# Patient Record
Sex: Male | Born: 1970
Health system: Southern US, Community
[De-identification: ages and names within clinical notes are randomized; demographics above are authoritative.]

## PROBLEM LIST (undated history)

## (undated) DIAGNOSIS — F32A Depression, unspecified: Secondary | ICD-10-CM

## (undated) DIAGNOSIS — T7840XA Allergy, unspecified, initial encounter: Secondary | ICD-10-CM

## (undated) DIAGNOSIS — F79 Unspecified intellectual disabilities: Secondary | ICD-10-CM

## (undated) DIAGNOSIS — R51 Headache: Secondary | ICD-10-CM

## (undated) DIAGNOSIS — M62838 Other muscle spasm: Secondary | ICD-10-CM

## (undated) DIAGNOSIS — G803 Athetoid cerebral palsy: Secondary | ICD-10-CM

## (undated) DIAGNOSIS — M2450 Contracture, unspecified joint: Secondary | ICD-10-CM

## (undated) DIAGNOSIS — R569 Unspecified convulsions: Secondary | ICD-10-CM

## (undated) DIAGNOSIS — G808 Other cerebral palsy: Secondary | ICD-10-CM

## (undated) DIAGNOSIS — G809 Cerebral palsy, unspecified: Secondary | ICD-10-CM

## (undated) DIAGNOSIS — F329 Major depressive disorder, single episode, unspecified: Secondary | ICD-10-CM

## (undated) DIAGNOSIS — R519 Headache, unspecified: Secondary | ICD-10-CM

## (undated) HISTORY — DX: Other cerebral palsy: G80.8

## (undated) HISTORY — DX: Unspecified intellectual disabilities: F79

## (undated) HISTORY — PX: COLON BIOPSY: SHX1369

## (undated) HISTORY — DX: Contracture, unspecified joint: M24.50

## (undated) HISTORY — DX: Allergy, unspecified, initial encounter: T78.40XA

## (undated) HISTORY — DX: Other muscle spasm: M62.838

## (undated) HISTORY — PX: OTHER SURGICAL HISTORY: SHX169

## (undated) HISTORY — DX: Athetoid cerebral palsy: G80.3

## (undated) HISTORY — DX: Cerebral palsy, unspecified: G80.9

## (undated) HISTORY — PX: TOE SURGERY: SHX1073

---

## 1998-08-03 ENCOUNTER — Ambulatory Visit (HOSPITAL_COMMUNITY): Admission: RE | Admit: 1998-08-03 | Discharge: 1998-08-03 | Payer: Self-pay | Admitting: Orthopaedic Surgery

## 2000-07-27 ENCOUNTER — Emergency Department (HOSPITAL_COMMUNITY): Admission: EM | Admit: 2000-07-27 | Discharge: 2000-07-27 | Payer: Self-pay | Admitting: Emergency Medicine

## 2004-05-11 ENCOUNTER — Encounter: Admission: RE | Admit: 2004-05-11 | Discharge: 2004-05-11 | Payer: Self-pay | Admitting: Family Medicine

## 2004-05-12 ENCOUNTER — Emergency Department (HOSPITAL_COMMUNITY): Admission: EM | Admit: 2004-05-12 | Discharge: 2004-05-12 | Payer: Self-pay | Admitting: Emergency Medicine

## 2004-06-04 ENCOUNTER — Emergency Department (HOSPITAL_COMMUNITY): Admission: EM | Admit: 2004-06-04 | Discharge: 2004-06-04 | Payer: Self-pay | Admitting: Emergency Medicine

## 2004-06-06 ENCOUNTER — Inpatient Hospital Stay (HOSPITAL_COMMUNITY): Admission: EM | Admit: 2004-06-06 | Discharge: 2004-06-07 | Payer: Self-pay | Admitting: Emergency Medicine

## 2006-04-08 ENCOUNTER — Ambulatory Visit: Payer: Self-pay | Admitting: Family Medicine

## 2006-04-16 ENCOUNTER — Encounter: Admission: RE | Admit: 2006-04-16 | Discharge: 2006-04-16 | Payer: Self-pay | Admitting: Family Medicine

## 2006-06-03 ENCOUNTER — Ambulatory Visit: Payer: Self-pay | Admitting: Family Medicine

## 2006-09-03 ENCOUNTER — Ambulatory Visit: Payer: Self-pay | Admitting: Family Medicine

## 2006-10-04 ENCOUNTER — Ambulatory Visit: Payer: Self-pay | Admitting: Family Medicine

## 2006-10-06 ENCOUNTER — Inpatient Hospital Stay (HOSPITAL_COMMUNITY): Admission: EM | Admit: 2006-10-06 | Discharge: 2006-10-09 | Payer: Self-pay | Admitting: Emergency Medicine

## 2006-10-07 ENCOUNTER — Ambulatory Visit: Payer: Self-pay | Admitting: *Deleted

## 2006-10-16 ENCOUNTER — Ambulatory Visit: Payer: Self-pay | Admitting: Family Medicine

## 2007-07-10 ENCOUNTER — Ambulatory Visit: Payer: Self-pay | Admitting: Family Medicine

## 2007-10-14 ENCOUNTER — Ambulatory Visit: Payer: Self-pay | Admitting: Family Medicine

## 2007-10-16 ENCOUNTER — Ambulatory Visit: Payer: Self-pay | Admitting: Family Medicine

## 2007-10-21 ENCOUNTER — Ambulatory Visit: Payer: Self-pay | Admitting: Family Medicine

## 2007-10-23 ENCOUNTER — Ambulatory Visit: Payer: Self-pay | Admitting: Family Medicine

## 2008-07-05 ENCOUNTER — Ambulatory Visit: Payer: Self-pay | Admitting: Family Medicine

## 2008-08-10 ENCOUNTER — Ambulatory Visit: Payer: Self-pay | Admitting: Family Medicine

## 2008-09-03 ENCOUNTER — Ambulatory Visit: Payer: Self-pay | Admitting: Family Medicine

## 2008-09-21 ENCOUNTER — Encounter: Admission: RE | Admit: 2008-09-21 | Discharge: 2008-09-21 | Payer: Self-pay | Admitting: Family Medicine

## 2008-09-21 ENCOUNTER — Ambulatory Visit: Payer: Self-pay | Admitting: Family Medicine

## 2009-01-11 ENCOUNTER — Ambulatory Visit: Payer: Self-pay | Admitting: Family Medicine

## 2009-03-15 ENCOUNTER — Ambulatory Visit: Payer: Self-pay | Admitting: Family Medicine

## 2009-06-01 ENCOUNTER — Ambulatory Visit: Payer: Self-pay | Admitting: Family Medicine

## 2009-08-16 ENCOUNTER — Ambulatory Visit: Payer: Self-pay | Admitting: Family Medicine

## 2009-08-20 ENCOUNTER — Emergency Department (HOSPITAL_BASED_OUTPATIENT_CLINIC_OR_DEPARTMENT_OTHER): Admission: EM | Admit: 2009-08-20 | Discharge: 2009-08-20 | Payer: Self-pay | Admitting: Emergency Medicine

## 2009-08-20 ENCOUNTER — Ambulatory Visit: Payer: Self-pay | Admitting: Diagnostic Radiology

## 2010-01-27 ENCOUNTER — Ambulatory Visit: Payer: Self-pay | Admitting: Family Medicine

## 2010-01-27 ENCOUNTER — Emergency Department (HOSPITAL_BASED_OUTPATIENT_CLINIC_OR_DEPARTMENT_OTHER): Admission: EM | Admit: 2010-01-27 | Discharge: 2010-01-27 | Payer: Self-pay | Admitting: Emergency Medicine

## 2010-01-27 ENCOUNTER — Ambulatory Visit: Payer: Self-pay | Admitting: Diagnostic Radiology

## 2010-01-28 ENCOUNTER — Emergency Department (HOSPITAL_BASED_OUTPATIENT_CLINIC_OR_DEPARTMENT_OTHER): Admission: EM | Admit: 2010-01-28 | Discharge: 2010-01-28 | Payer: Self-pay | Admitting: Emergency Medicine

## 2010-03-07 ENCOUNTER — Ambulatory Visit: Payer: Self-pay | Admitting: Family Medicine

## 2010-06-06 ENCOUNTER — Encounter
Admission: RE | Admit: 2010-06-06 | Discharge: 2010-06-06 | Payer: Self-pay | Source: Home / Self Care | Attending: Family Medicine | Admitting: Family Medicine

## 2010-08-09 ENCOUNTER — Emergency Department (INDEPENDENT_AMBULATORY_CARE_PROVIDER_SITE_OTHER): Payer: PRIVATE HEALTH INSURANCE

## 2010-08-09 ENCOUNTER — Emergency Department (HOSPITAL_BASED_OUTPATIENT_CLINIC_OR_DEPARTMENT_OTHER)
Admission: EM | Admit: 2010-08-09 | Discharge: 2010-08-09 | Disposition: A | Discharge status: Home / Self Care | Payer: PRIVATE HEALTH INSURANCE | Attending: Emergency Medicine | Admitting: Emergency Medicine

## 2010-08-09 DIAGNOSIS — H538 Other visual disturbances: Secondary | ICD-10-CM

## 2010-08-09 DIAGNOSIS — W2209XA Striking against other stationary object, initial encounter: Secondary | ICD-10-CM | POA: Insufficient documentation

## 2010-08-09 DIAGNOSIS — L989 Disorder of the skin and subcutaneous tissue, unspecified: Secondary | ICD-10-CM | POA: Insufficient documentation

## 2010-08-09 DIAGNOSIS — Y92009 Unspecified place in unspecified non-institutional (private) residence as the place of occurrence of the external cause: Secondary | ICD-10-CM | POA: Insufficient documentation

## 2010-08-09 DIAGNOSIS — G809 Cerebral palsy, unspecified: Secondary | ICD-10-CM | POA: Insufficient documentation

## 2010-08-09 DIAGNOSIS — S0003XA Contusion of scalp, initial encounter: Secondary | ICD-10-CM | POA: Insufficient documentation

## 2010-08-09 DIAGNOSIS — Y93E1 Activity, personal bathing and showering: Secondary | ICD-10-CM | POA: Insufficient documentation

## 2010-08-14 LAB — ROCKY MTN SPOTTED FVR AB, IGM-BLOOD: RMSF IgM: 0.1 IV (ref 0.00–0.89)

## 2010-08-14 LAB — ROCKY MTN SPOTTED FVR AB, IGG-BLOOD: RMSF IgG: 0.59 IV

## 2010-09-19 NOTE — H&P (Signed)
NAME:  TAITEN, BRAWN              ACCOUNT NO.:  [id number]   MEDICAL RECORD NO.:  [id number]            PATIENT TYPE:   LOCATION:                                 FACILITY:   PHYSICIAN:  Wilson Singer, M.D.     DATE OF BIRTH:   DATE OF ADMISSION:  10/06/2006  DATE OF DISCHARGE:                              HISTORY & PHYSICAL   HISTORY:  This is a 40 year old man who has cerebral palsy and who now  presents with a 3 day history of left arm/elbow/forearm redness and  swelling which has not improved with outpatient ciprofloxacin.  Apparently the redness started around the left elbow 3 days ago and  seems to have progressed with streaking toward the left forearm and now  is even entering the left hand.  The patient has had low grade fevers at  the physician's office.  He, otherwise, is eating and drinking well  without any other systemic effects.  The etiology of the cellulitis is  not obviously clear from the history.   PAST MEDICAL HISTORY:  Cerebral palsy from birth.   PAST SURGICAL HISTORY:  Tendon surgery in the past as a child on his  legs.   MEDICATIONS:  He is on no regular medications.   ALLERGIES:  1. PENICILLIN which produced a rash.  2. On his last hospitalization, in 2006, he had some kind of allergic      reaction to AVELOX.   SOCIAL HISTORY:  He lives with his parents.  He does not smoke and does  not drink alcohol.  He is unemployed.   FAMILY HISTORY:  Noncontributory.   REVIEW OF SYSTEMS:  Apart from the symptoms mentioned above, there are  no other symptoms referable to all systems reviewed.   PHYSICAL EXAMINATION:  VITAL SIGNS:  Temperature is 98.8, blood pressure  144/94, pulse 90 per minute, respiratory rate 13, saturation 99%.  GENERAL:  He does not look clinically toxic.  He is alert and awake.  CARDIOVASCULAR:  The heart sounds are present and normal without  murmurs.  RESPIRATORY:  Lung fields are clear.  ABDOMEN:  Soft and non-tender.  NEUROLOGICAL:  He had contractures in his arms and legs consistent with  his cerebral palsy.  SKIN:  There is spreading cellulitis in the left arm/elbow/forearm area.  There is possible as source of entry at the left elbow area.   INVESTIGATIONS:  Hemoglobin 13.1, white blood cell count 8800, platelet  count 180,000, sodium 137, potassium 3.7, chloride 103, bicarbonate 27,  glucose 97, BUN 11, creatinine 0.78.   IMPRESSION:  1. Left arm cellulitis.  2. Cerebral palsy.   PLAN:  1. Admit.  2. Intravenous antibiotics.  3. Further recommendations will depend on the patient's hospital      progress.      Wilson Singer, M.D.  Electronically Signed     NCG/MEDQ  D:  10/06/2006  T:  10/06/2006  Job:  [id number]   cc:   Sharlot Gowda, M.D.  Fax: [phone]

## 2010-09-19 NOTE — Discharge Summary (Signed)
NAMEBREYLON, Joel Dudley              ACCOUNT NO.:  [id number]   MEDICAL RECORD NO.:  [id number]          PATIENT TYPE:  INP   LOCATION:  1609                         FACILITY:  [facility]   PHYSICIAN:  Madaline Savage, MD        DATE OF BIRTH:  06/20/1970   DATE OF ADMISSION:  10/06/2006  DATE OF DISCHARGE:  10/09/2006                               DISCHARGE SUMMARY   PRIMARY CARE PHYSICIAN:  Sharlot Gowda, M.D.   DISCHARGE DIAGNOSES:  1. Left elbow cellulitis.  2. Cerebral palsy.   DISCHARGE MEDICATIONS:  Doxycycline 100 mg twice daily for 7 more days.   HISTORY OF PRESENT ILLNESS:  For a full history and physical, see the  history and physical dictated by Dr. Karilyn Cota.  In short, Joel Dudley is a  40 year old gentleman with a history of cerebral palsy who comes in with  3 days of left arm/elbow redness and swelling for which he was started  on ciprofloxacin as an outpatient and which has not improved.  He was  admitted for IV antibiotics.   PROBLEM LIST:  1. Left elbow cellulitis.  He was admitted and started on IV      antibiotics.  He was started on Ancef while he was in the hospital,      and cultures were obtained.  His blood cultures have all been      negative while in the hospital, and his antibiotic is now being      changed to doxycycline.  He is allergic to penicillin, and he has a      questionable allergy to Avelox.  His swelling is considerably      improved, the redness has come down, and we will complete his      course of antibiotics with 7 more days of doxycycline at home.  2. Cerebral palsy.  He will follow up with his family doctor.   DISPOSITION AT THIS TIME:  He is stable enough to be discharged home.   FOLLOWUP:  He is asked to follow up with his primary care doctor, Dr.  Susann Givens, in 1 week.      Madaline Savage, MD  Electronically Signed     PKN/MEDQ  D:  10/09/2006  T:  10/09/2006  Job:  [id number]

## 2010-09-22 NOTE — Discharge Summary (Signed)
Joel Dudley, Joel Dudley              ACCOUNT NO.:  [id number]   MEDICAL RECORD NO.:  [id number]          PATIENT TYPE:  INP   LOCATION:  0482                         FACILITY:  [facility]   PHYSICIAN:  Elliot Cousin, M.D.    DATE OF BIRTH:  [birth year]   DATE OF ADMISSION:  06/06/2004  DATE OF DISCHARGE:  06/07/2004                                 DISCHARGE SUMMARY   DISCHARGE DIAGNOSES:  1.  Right lower extremity cellulitis, status post spider bite.  2.  Allergic reaction to Avelox.  3.  History of PENICILLIN allergy.  4.  Cerebral palsy.   DISCHARGE MEDICATIONS:  1.  Erythromycin 333 mg, 2 tablets daily for an additional five days.  2.  If the patient cannot tolerate erythromycin then start Zithromax 250 mg      daily for the next five days.  3.  Flexeril 10 mg b.i.d.  4.  Elocon ointment 0.1% applied to the affected skin once daily for the      next seven days.   DISPOSITION:  The patient was discharged to home in improved and stable  condition on June 07, 2004.  The patient (via his mother) was advised to  followup with Dr. Susann Givens in 5-7 days.   HISTORY OF PRESENT ILLNESS:  The patient is a 40 year old man with a history  of cerebral palsy who presented to the emergency department on June 05, 2004 with swelling and redness and tenderness of the right leg.  The patient  was actually seen in the emergency department on June 04, 2004 for the  same symptoms. Apparently the patient had been bitten by a spider.  The  patient was prescribed erythromycin by the emergency department physician.  The patient had taken at least two doses of the erythromycin.  However,  there was more increase in swelling and redness of the leg and therefore the  patient presented to the hospital again.   HOSPITAL COURSE:  #1.  RIGHT LOWER EXTREMITY CELLULITIS.  The history of a  spider bite was presented by the patient's mother.  The patient apparently  had completed two doses of erythromycin as  it was prescribed by the  emergency department physician on June 04, 2004.  On admission, the  patient's white blood cell count was completely within normal limits and he  had no signs or symptoms consistent with fever.  His temperature was 96 on  admission.  Avelox 400 mg IV was started empirically for what may have been  a superimposed bacterial infection.  However, the patient developed swelling  and redness at the IV site where the Avelox was infusing. Therefore the  Avelox was discontinued and the patient was started on Zithromax 500 mg IV  daily.  Blood cultures were ordered during the hospitalization.  The blood  cultures were negative x1 day.  The following day, the patient remained  afebrile and his white blood cell count remained within normal limits at  6.9.  There was less diffuse erythema of the right leg per the mother's  assessment.  On my followup exam (although I did not see  the patient  initially in the ED), there was focal mild erythema over what was thought to  be the spider bite site. There was very little or no generalized erythema of  the right leg.  There was mild tenderness over the small area where the  spider bite may have occurred.  When asked the patient stated that the pain  was less intense and the swelling had improved.  On followup assessment, it  did not appear that the area was particularly infectious; however, it was  felt that the erythema was more or less an inflammatory response from the  spider bite. Therefore the patient was prescribed a topical steroid, Elocon,  to be applied to the irritated site once daily for seven days.  The patient  via his mother was also advised to continue antibiotic therapy as tolerated  for the next five days. This would be empiric treatment.  The patient was  stable enough to be discharged the following day.  He was less symptomatic  and he was afebrile and did not have a leukocytosis.  The patient and his  mother were  also advised to apply warm compresses to the right leg 3-4 times  daily as needed and to cleanse the area with an antibacterial soap daily.   The patient will followup with his primary care physician, Dr. Susann Givens, in 5-  7 days.  The patient and the patient's mother were advised to return to the  hospital if  the patient developed fever and chills.      DF/MEDQ  D:  06/08/2004  T:  06/08/2004  Job:  [id number]   cc:   Sharlot Gowda, M.D.  [address]  [place], [place] [address]  Fax: [phone]

## 2010-09-22 NOTE — H&P (Signed)
NAMEHAYDIN, CALANDRA              ACCOUNT NO.:  [id number]   MEDICAL RECORD NO.:  [id number]          PATIENT TYPE:  INP   LOCATION:  0482                         [facility]ACILITY:  [facility]   PHYSICIAN:  Lonia Blood, M.D.      DATE OF BIRTH:  Nov 11, 1970   DATE OF ADMISSION:  06/05/2004  DATE OF DISCHARGE:                                HISTORY & PHYSICAL   PRIMARY CARE PHYSICIAN:  Dr. Sharlot Gowda   PRESENTING COMPLAINT:  Right lower extremity pain and swelling.   HISTORY OF PRESENT ILLNESS:  This is a 40 year old male with history of  cerebral palsy who was brought in by his Mom secondary to bitten by a  spider last Saturday on the right mid leg.  The patient has since then been  having progressive swelling and pain of the right lower extremity. He was  seen in the ED on June 04, 2004, started on oral antibiotics, Zithromax  to be exact. The patient's condition did not improve. The next day he was  taken to see Dr. Susann Givens on June 05, 2004 who saw the patient and  informed the family to return to the ED if the swelling got worse.  The  patient was brought back to the ED on the night of June 05, 2004 because  of increase in his lower extremity swelling.  According to Mom there has  been no fever. However, the pain continues.   PAST MEDICAL HISTORY:  Mainly cerebral palsy.   ALLERGIES:  PENICILLIN.   SOCIAL HISTORY:  The patient lives at home with his Mom who takes care of  him. No tobacco or alcohol use.   FAMILY HISTORY:  No family history of hypertension, diabetes, or cancer at  this point.   REVIEW OF SYSTEMS:  Essentially not obtainable. The patient does not give an  adequate history, but per Mom the patient's condition has not changed  significantly except for these symptoms.  He has some back pain, otherwise  no other complaints.   PHYSICAL EXAMINATION:  VITAL SIGNS:  Temperature is 96, blood pressure  123/86, pulse 112, respiratory rate 28, saturations 97% on room  air.  GENERAL:  The patient is lying in bed, very alert. Moaning slightly in pain.  Responded to voice but not communicating adequately. The patient has a  cerebral palsy posture with mild opening to the side.  HEENT:  Pupils are equal, round and reactive to light.  NECK:  Supple, no jugular venous distension, no lymphadenopathy.  CHEST:  Good air entry bilaterally. No wheezes or rales.  CARDIOVASCULAR:  The patient is tachycardic.  ABDOMEN:  Soft, nontender with positive bowel sounds.  EXTREMITIES:  Show no edema, cyanosis or clubbing. Absence of the right  great toe. Also showed contractures consistent with cerebral palsy.   ASSESSMENT:  This is a 40 year old patient with cerebral palsy coming in  with left lower extremity swelling. The right leg looks red, swollen, with a  raised papule in the shin area. The area is swollen and warm. Mainly  consistent with possible cellulitis. Family feels patient may have been  bitten  by a spider, however there is no explanation for his current symptoms  and signs. In light of the fact that patient has tried oral antibiotics, I  have explained to the mother that since the patient responded to the initial  oral antibiotic, the worsening of the inflammation is consistent with a  situation where by the bacteria is killed by the antibiotics, thereby  releasing its contents and inducing over-inflammation. With apprehension of  the family and the fact the patient has poor understanding of his status, I  will go ahead and admit him for probable 23-hour observation on some IV  antibiotics.  Once his condition stabilizes will switch to oral. The patient is allergic  to Penicillin so I will start him on some quinolones as an interim measure.  I will use Avelox for now. He will probably be discharged on oral Avelox or  Levaquin or any of the other quinolones. In the meantime however, for his  cerebral palsy, I will continue him on the Flexeril that he is  taking. I  will also give him some low dose Morphine and Tylenol for pain control.      LG/MEDQ  D:  06/06/2004  T:  06/06/2004  Job:  [id number]   cc:   [facility]   Sharlot Gowda, M.D.  [address]  [place], [place] [address]  Fax: [phone]

## 2010-11-12 ENCOUNTER — Emergency Department (HOSPITAL_BASED_OUTPATIENT_CLINIC_OR_DEPARTMENT_OTHER)
Admission: EM | Admit: 2010-11-12 | Discharge: 2010-11-12 | Disposition: A | Discharge status: Home / Self Care | Payer: PRIVATE HEALTH INSURANCE | Attending: Emergency Medicine | Admitting: Emergency Medicine

## 2010-11-12 ENCOUNTER — Encounter: Payer: Self-pay | Admitting: *Deleted

## 2010-11-12 DIAGNOSIS — S0180XA Unspecified open wound of other part of head, initial encounter: Secondary | ICD-10-CM | POA: Insufficient documentation

## 2010-11-12 DIAGNOSIS — S0181XA Laceration without foreign body of other part of head, initial encounter: Secondary | ICD-10-CM

## 2010-11-12 DIAGNOSIS — S1093XA Contusion of unspecified part of neck, initial encounter: Secondary | ICD-10-CM | POA: Insufficient documentation

## 2010-11-12 DIAGNOSIS — W19XXXA Unspecified fall, initial encounter: Secondary | ICD-10-CM | POA: Insufficient documentation

## 2010-11-12 DIAGNOSIS — S0003XA Contusion of scalp, initial encounter: Secondary | ICD-10-CM | POA: Insufficient documentation

## 2010-11-12 DIAGNOSIS — S0083XA Contusion of other part of head, initial encounter: Secondary | ICD-10-CM

## 2010-11-12 NOTE — ED Provider Notes (Signed)
History     Chief Complaint  Patient presents with  . Fall  . Head Laceration   Patient is a 40 y.o. male presenting with fall. The history is provided by the patient, a caregiver and a parent. The history is limited by the condition of the patient.  Fall Incident onset: 7 am this morning. The fall occurred while standing. He landed on a hard floor. The point of impact was the head. Pertinent negatives include no fever and no vomiting.  pt w hx cp, in wheelchair. Gets around home on hands, knees and/or wheelchair. This am in bathroom, slipped on floor, hit forehead on tile floor. No loc. Acting normally since, at baseline. No vomiting. No other c/o. Had lac to forehead which has stopped bleeding. No nosebleeds.  Has remained content/alert.   History reviewed. No pertinent past medical history.  History reviewed. No pertinent past surgical history.  History reviewed. No pertinent family history.  History  Substance Use Topics  . Smoking status: Never Smoker   . Smokeless tobacco: Not on file  . Alcohol Use: No      Review of Systems  Unable to perform ROS Constitutional: Negative for fever.  HENT: Negative for nosebleeds.   Respiratory: Negative for shortness of breath.   Gastrointestinal: Negative for vomiting.  Psychiatric/Behavioral: Negative for behavioral problems.    Physical Exam  BP 116/77  Pulse 80  Temp(Src) 97.7 F (36.5 C) (Oral)  Resp 18  SpO2 100%  Physical Exam  Nursing note and vitals reviewed. Constitutional: He is oriented to person, place, and time. He appears well-developed and well-nourished. No distress.  HENT:       Contusion w < 1 cm, superficial, healing lac to forehead w skin edges well approx, no fb seen or felt. No sign of infection of wound.   Eyes: Pupils are equal, round, and reactive to light.  Neck: Neck supple. No tracheal deviation present.  Cardiovascular: Normal rate.   Pulmonary/Chest: Effort normal. No accessory muscle usage.  No respiratory distress.  Abdominal: He exhibits no distension.  Musculoskeletal: Normal range of motion.       Cervical back: Normal. He exhibits no tenderness, no swelling and no pain.  Neurological: He is alert and oriented to person, place, and time.  Skin: Skin is warm and dry.  Psychiatric: He has a normal mood and affect.    ED Course  Procedures  MDM Spine nt. Pt alert, ms at baseline since fall early this am. Wound healing. Wound cleaned, staff to apply steristrips. fam states tet is up to date.       Suzi Roots, MD 11/12/10 [phone]

## 2010-11-12 NOTE — ED Notes (Signed)
Pt presents to ED today s/p fall in shower this am striking head on ceramic tile. Bleeding is controlled at present.  Pt has special needs and is wheelchair bound.  Mother states no changes in LOC today

## 2010-11-17 ENCOUNTER — Encounter: Payer: Self-pay | Admitting: Family Medicine

## 2010-11-17 ENCOUNTER — Telehealth: Payer: Self-pay

## 2010-11-17 ENCOUNTER — Ambulatory Visit (INDEPENDENT_AMBULATORY_CARE_PROVIDER_SITE_OTHER): Payer: PRIVATE HEALTH INSURANCE | Admitting: Family Medicine

## 2010-11-17 VITALS — BP 116/70 | HR 60

## 2010-11-17 DIAGNOSIS — S0081XA Abrasion of other part of head, initial encounter: Secondary | ICD-10-CM

## 2010-11-17 DIAGNOSIS — IMO0002 Reserved for concepts with insufficient information to code with codable children: Secondary | ICD-10-CM

## 2010-11-17 NOTE — Patient Instructions (Signed)
Call me if the swelling, redness or temperature gets worse

## 2010-11-17 NOTE — Progress Notes (Signed)
  Subjective:    Patient ID: Joel Dudley, male    DOB: Apr 25, 1971, 40 y.o.   MRN: [id number]  HPI He fell injuring his face several days ago. His mother noted some swelling today and brought him in for evaluation for possible infection.   Review of Systems     Objective:   Physical Exam Alert and in no distress. 2 healing abrasions/lacerations are noted. One on the midforehead and one on the left side of the nose. They're not erythematous warm or tender.       Assessment & Plan:  Facial abrasions Reassured the patient and his mother that they did not appear infected. Recommend keeping in touch with me and if evidence of infection,they will call me.

## 2010-11-17 NOTE — Telephone Encounter (Signed)
PT MOM CALLED AND SAID THAT SHE WANTS THE ANTIBIOTIC THAT YA'LL TALKED ABOUT

## 2010-11-27 ENCOUNTER — Ambulatory Visit (INDEPENDENT_AMBULATORY_CARE_PROVIDER_SITE_OTHER): Payer: PRIVATE HEALTH INSURANCE | Admitting: Family Medicine

## 2010-11-27 ENCOUNTER — Encounter: Payer: Self-pay | Admitting: Family Medicine

## 2010-11-27 VITALS — BP 130/80 | HR 86 | Temp 98.5°F

## 2010-11-27 DIAGNOSIS — R5383 Other fatigue: Secondary | ICD-10-CM

## 2010-11-27 DIAGNOSIS — G809 Cerebral palsy, unspecified: Secondary | ICD-10-CM

## 2010-11-27 DIAGNOSIS — G801 Spastic diplegic cerebral palsy: Secondary | ICD-10-CM

## 2010-11-27 DIAGNOSIS — G808 Other cerebral palsy: Secondary | ICD-10-CM | POA: Insufficient documentation

## 2010-11-27 LAB — CBC WITH DIFFERENTIAL/PLATELET
Eosinophils Relative: 1 % (ref 0–5)
HCT: 41.1 % (ref 39.0–52.0)
Hemoglobin: 14.1 g/dL (ref 13.0–17.0)
Lymphocytes Relative: 27 % (ref 12–46)
Lymphs Abs: 1.8 10*3/uL (ref 0.7–4.0)
MCV: 86.7 fL (ref 78.0–100.0)
Platelets: 247 10*3/uL (ref 150–400)
RBC: 4.74 MIL/uL (ref 4.22–5.81)
WBC: 6.6 10*3/uL (ref 4.0–10.5)

## 2010-11-27 LAB — COMPREHENSIVE METABOLIC PANEL
ALT: 10 U/L (ref 0–53)
CO2: 23 mEq/L (ref 19–32)
Calcium: 9.3 mg/dL (ref 8.4–10.5)
Chloride: 105 mEq/L (ref 96–112)
Creat: 0.99 mg/dL (ref 0.50–1.35)
Sodium: 139 mEq/L (ref 135–145)
Total Protein: 6.5 g/dL (ref 6.0–8.3)

## 2010-11-27 LAB — TSH: TSH: 3.358 u[IU]/mL (ref 0.350–4.500)

## 2010-11-27 NOTE — Progress Notes (Signed)
  Subjective:    Patient ID: Joel Dudley, male    DOB: [birth year], 40 y.o.   MRN: [id number]  HPI And he is here complaining of difficulty with a jerking sensation on the inside for the last several months. He has had some episodes of vomiting headache well as some dizziness but no earache, sore throat, cough or congestion   Review of Systems     Objective:   Physical Exam alert and in no distress. Tympanic membranes and canals are normal. Throat is clear. Tonsils are normal. Neck is supple without adenopathy or thyromegaly. Cardiac exam shows a regular sinus rhythm without murmurs or gallops. Lungs are clear to auscultation. Abdominal exam shows no masses or tenderness but exam difficult due to to muscle tension        Assessment & Plan:  CP. Malaise and fatigue, etiology unclear Routine blood screening.

## 2010-11-27 NOTE — Patient Instructions (Signed)
Keep an eye on him in case anything changes and I will call when I get the results

## 2010-11-28 ENCOUNTER — Telehealth: Payer: Self-pay

## 2010-11-28 NOTE — Telephone Encounter (Signed)
Called pt informed mom of results

## 2010-12-15 ENCOUNTER — Telehealth: Payer: Self-pay | Admitting: Family Medicine

## 2010-12-15 NOTE — Telephone Encounter (Signed)
MOM INFORMED-LM

## 2010-12-15 NOTE — Telephone Encounter (Signed)
Mom called about rx for Flexeril      Mom# [phone]

## 2011-02-22 LAB — COMPREHENSIVE METABOLIC PANEL
ALT: 28
Albumin: 2.9 — ABNORMAL LOW
Alkaline Phosphatase: 52
GFR calc Af Amer: >60
Potassium: 4
Sodium: 138
Total Protein: 5.5 — ABNORMAL LOW

## 2011-02-22 LAB — CBC
HCT: 41.9
Hemoglobin: 14.4
MCHC: 34.5
Platelets: 194
RBC: 4.76
RDW: 12.3
RDW: 12.5

## 2011-02-22 LAB — BASIC METABOLIC PANEL
CO2: 30
GFR calc non Af Amer: >60
Glucose, Bld: 100 — ABNORMAL HIGH
Potassium: 4.1
Sodium: 139

## 2011-03-13 ENCOUNTER — Ambulatory Visit (INDEPENDENT_AMBULATORY_CARE_PROVIDER_SITE_OTHER): Payer: PRIVATE HEALTH INSURANCE | Admitting: Family Medicine

## 2011-03-13 ENCOUNTER — Encounter: Payer: Self-pay | Admitting: Family Medicine

## 2011-03-13 VITALS — BP 120/80 | HR 80 | Ht 60.0 in | Wt 165.0 lb

## 2011-03-13 DIAGNOSIS — Z23 Encounter for immunization: Secondary | ICD-10-CM

## 2011-03-13 DIAGNOSIS — G809 Cerebral palsy, unspecified: Secondary | ICD-10-CM

## 2011-03-13 MED ORDER — CARISOPRODOL 350 MG PO TABS: 350.0000 mg | ORAL_TABLET | Freq: Two times a day (BID) | ORAL | Status: DC | PRN

## 2011-03-13 NOTE — Progress Notes (Signed)
  Subjective:    Patient ID: Joel Dudley, male    DOB: July 09, 1970, 40 y.o.   MRN: [id number]  HPI He is here for an interval evaluation. He continues to do quite well. He does want a [employer]. His mother also notes that soma does help him control his spasticity. She would like a refill on this. He apparently was seen earlier this year for evaluation of chest pain and did follow up with a cardiologist. To use aspirin and do have nitroglycerin however the workup was apparently negative. They have no other concerns or complaints.   Review of Systems     Objective:   Physical Exam alert and in no distress. Tympanic membranes and canals are normal. Throat is clear. Tonsils are normal. Neck is supple without adenopathy or thyromegaly. Cardiac exam shows a regular sinus rhythm without murmurs or gallops. Lungs are clear to auscultation. Abdominal exam shows no masses or tenderness       Assessment & Plan:  Cerebral palsy. I will call in soma. Flu shot also given.

## 2011-04-20 ENCOUNTER — Encounter (HOSPITAL_COMMUNITY): Payer: Self-pay | Admitting: *Deleted

## 2011-04-20 ENCOUNTER — Emergency Department (HOSPITAL_COMMUNITY): Payer: PRIVATE HEALTH INSURANCE

## 2011-04-20 ENCOUNTER — Emergency Department (HOSPITAL_COMMUNITY)
Admission: EM | Admit: 2011-04-20 | Discharge: 2011-04-20 | Disposition: A | Discharge status: Home / Self Care | Payer: PRIVATE HEALTH INSURANCE | Attending: Emergency Medicine | Admitting: Emergency Medicine

## 2011-04-20 DIAGNOSIS — S0510XA Contusion of eyeball and orbital tissues, unspecified eye, initial encounter: Secondary | ICD-10-CM | POA: Insufficient documentation

## 2011-04-20 DIAGNOSIS — F79 Unspecified intellectual disabilities: Secondary | ICD-10-CM | POA: Insufficient documentation

## 2011-04-20 DIAGNOSIS — Z7982 Long term (current) use of aspirin: Secondary | ICD-10-CM | POA: Insufficient documentation

## 2011-04-20 DIAGNOSIS — W19XXXA Unspecified fall, initial encounter: Secondary | ICD-10-CM

## 2011-04-20 DIAGNOSIS — S0083XA Contusion of other part of head, initial encounter: Secondary | ICD-10-CM

## 2011-04-20 DIAGNOSIS — Z79899 Other long term (current) drug therapy: Secondary | ICD-10-CM | POA: Insufficient documentation

## 2011-04-20 DIAGNOSIS — S0511XA Contusion of eyeball and orbital tissues, right eye, initial encounter: Secondary | ICD-10-CM

## 2011-04-20 DIAGNOSIS — R51 Headache: Secondary | ICD-10-CM | POA: Insufficient documentation

## 2011-04-20 DIAGNOSIS — S0003XA Contusion of scalp, initial encounter: Secondary | ICD-10-CM | POA: Insufficient documentation

## 2011-04-20 DIAGNOSIS — S0990XA Unspecified injury of head, initial encounter: Secondary | ICD-10-CM | POA: Insufficient documentation

## 2011-04-20 DIAGNOSIS — H538 Other visual disturbances: Secondary | ICD-10-CM | POA: Insufficient documentation

## 2011-04-20 DIAGNOSIS — W07XXXA Fall from chair, initial encounter: Secondary | ICD-10-CM | POA: Insufficient documentation

## 2011-04-20 DIAGNOSIS — H5789 Other specified disorders of eye and adnexa: Secondary | ICD-10-CM | POA: Insufficient documentation

## 2011-04-20 DIAGNOSIS — S1093XA Contusion of unspecified part of neck, initial encounter: Secondary | ICD-10-CM | POA: Insufficient documentation

## 2011-04-20 DIAGNOSIS — G809 Cerebral palsy, unspecified: Secondary | ICD-10-CM | POA: Insufficient documentation

## 2011-04-20 MED ORDER — OXYCODONE-ACETAMINOPHEN 5-325 MG PO TABS: 1.0000 | ORAL_TABLET | Freq: Four times a day (QID) | ORAL | Status: AC | PRN

## 2011-04-20 MED ORDER — OXYCODONE-ACETAMINOPHEN 5-325 MG PO TABS
2.0000 | ORAL_TABLET | Freq: Once | ORAL | Status: AC
  Administered 2011-04-20: 2 via ORAL
  Filled 2011-04-20: qty 2

## 2011-04-20 NOTE — ED Notes (Signed)
Per patient family- patient fell out of his wheel chair yesterday and he was upset today about the pads in the shower at home and jumped out of his wheel chair and hit is head and eye on hardwood floor. Patient's mother took him to hospital in [place] and was released after a CT scan revealed no broken bones.  Patients mother states that the patient started to have blurred vision. Patient denies N/V and only pain in his eye and side of his head. Patient has CP and lives with his parents. Patient understand everything going on but it is difficult to understand his speech. Patient's mother and father speak for and interprets for the patient.

## 2011-04-20 NOTE — ED Notes (Signed)
Patient resting with NAD at this time. Family at bedside giving patient sprite to drink and talking with him.

## 2011-04-20 NOTE — ED Provider Notes (Signed)
History    40yM brought in after parents after fall. Pt had fall in shower yesterday. And again today when threw self out of chair because was mad. Per mother, was evaluated at OSH yesterday and again today.  Not sure what exact imaging studies done but mother thinks had CTs. Told everything looked OK. Brought in now because says is having trouble with vision. No interim trauma that they are aware of. Pt with hx of CP and crawls/self transfers a lot. Frequently strikes head. Denies pain aside from head/face. No acute change in MS per parents. No chronic blood thinning medication use.  CSN: [id number] Arrival date & time: 04/20/2011  3:34 PM   First MD Initiated Contact with Patient 04/20/11 1543      Chief Complaint  Patient presents with  . Fall    (Consider location/radiation/quality/duration/timing/severity/associated sxs/prior treatment) HPI  Past Medical History  Diagnosis Date  . Allergy     RHINITIS  . Cerebral palsy   . MR (mental retardation)     No past surgical history on file.  No family history on file.  History  Substance Use Topics  . Smoking status: Never Smoker   . Smokeless tobacco: Not on file  . Alcohol Use: No      Review of Systems   Review of symptoms negative unless otherwise noted in HPI.   Allergies  Codeine and Penicillins  Home Medications   Current Outpatient Rx  Name Route Sig Dispense Refill  . ASPIRIN 325 MG PO TBEC Oral Take 325 mg by mouth as needed.     Marland Kitchen CARISOPRODOL 350 MG PO TABS Oral Take 1 tablet (350 mg total) by mouth 2 (two) times daily as needed. 60 tablet 5  . NITROGLYCERIN 0.3 MG SL SUBL Sublingual Place 0.3 mg under the tongue every 5 (five) minutes as needed.       BP 134/98  Pulse 129  Temp(Src) 98 F (36.7 C) (Oral)  Resp 16  SpO2 99%  Physical Exam  Nursing note and vitals reviewed. Constitutional: No distress.       Sitting up in wheel chair. General appearance consistent with hx of CP.  HENT:    Head: Normocephalic and atraumatic.  Eyes: Right eye exhibits no discharge. Left eye exhibits no discharge.       Extensive ecchymosis of R face. R eye swollen shut. Cannot actively open eye.  Neck: Neck supple.  Cardiovascular: Normal rate, regular rhythm and normal heart sounds.  Exam reveals no gallop and no friction rub.   No murmur heard. Pulmonary/Chest: Effort normal and breath sounds normal. No respiratory distress.  Abdominal: Soft. He exhibits no distension. There is no tenderness.  Musculoskeletal: He exhibits no edema and no tenderness.  Neurological: He is alert.  Skin: Skin is warm and dry.  Psychiatric: He has a normal mood and affect. His behavior is normal. Thought content normal.    ED Course  Procedures (including critical care time)  Labs Reviewed - No data to display Ct Head Wo Contrast  04/20/2011  *RADIOLOGY REPORT*  Clinical Data: Fall.  Right-sided face and scalp hematoma. Headache and blurred vision.  The patient also fell yesterday.  CT HEAD WITHOUT CONTRAST,CT CERVICAL SPINE WITHOUT CONTRAST  CT OF THE HEAD WITHOUT CONTRAST:  Technique:  Contiguous axial images were obtained from the base of the skull through the vertex without contrast.,Technique: Multidetector CT imaging of the cervical spine was performed. Multiplanar CT image reconstructions were also generated.  Technique:  Multidetector  CT imaging of the cervical spine was performed without intravenous contrast.  Multiplanar CT image reconstructions were also generated.  Comparison: 08/09/2010  Findings: There is no intra or extra-axial fluid collection or mass.  The basilar cisterns and ventricles have a normal appearance.  There is extensive soft tissue swelling involving the frontal, right temporal and parietal scalp, extending to involve the occipital scalp.  No underlying calvarial fracture. Patient motion artifact is present.  IMPRESSION:  1.  Extensive scalp edema. 2.  No evidence for fracture. 3. No  evidence for acute intracranial abnormality.  CT CERVICAL SPINE WITHOUT CONTRAST:  Findings:  There is moderate degenerative change within the cervical spine.  Changes are most notable at C3-4, C4-5, C5-6, and C6-7.  There is no evidence for acute fracture or subluxation. Lung apices are clear.  IMPRESSION:  1.  Degenerative changes in the cervical spine. 2.  No evidence for acute abnormality.  Original Report Authenticated By: Patterson Hammersmith, M.D.   Ct Cervical Spine Wo Contrast  04/20/2011  *RADIOLOGY REPORT*  Clinical Data: Fall.  Right-sided face and scalp hematoma. Headache and blurred vision.  The patient also fell yesterday.  CT HEAD WITHOUT CONTRAST,CT CERVICAL SPINE WITHOUT CONTRAST  CT OF THE HEAD WITHOUT CONTRAST:  Technique:  Contiguous axial images were obtained from the base of the skull through the vertex without contrast.,Technique: Multidetector CT imaging of the cervical spine was performed. Multiplanar CT image reconstructions were also generated.  Technique:  Multidetector CT imaging of the cervical spine was performed without intravenous contrast.  Multiplanar CT image reconstructions were also generated.  Comparison: 08/09/2010  Findings: There is no intra or extra-axial fluid collection or mass.  The basilar cisterns and ventricles have a normal appearance.  There is extensive soft tissue swelling involving the frontal, right temporal and parietal scalp, extending to involve the occipital scalp.  No underlying calvarial fracture. Patient motion artifact is present.  IMPRESSION:  1.  Extensive scalp edema. 2.  No evidence for fracture. 3. No evidence for acute intracranial abnormality.  CT CERVICAL SPINE WITHOUT CONTRAST:  Findings:  There is moderate degenerative change within the cervical spine.  Changes are most notable at C3-4, C4-5, C5-6, and C6-7.  There is no evidence for acute fracture or subluxation. Lung apices are clear.  IMPRESSION:  1.  Degenerative changes in the cervical  spine. 2.  No evidence for acute abnormality.  Original Report Authenticated By: Patterson Hammersmith, M.D.     1. Closed head injury   2. Contusion of periorbital region, right   3. Facial contusion   4. Traumatic hematoma of scalp   5. Fall     4:02 PM Pt assessed shortly after arrival. . Do not feel emergent imaging indicated at this time and will await records from OSH before proceding with further wu.   4:32 PM Records reviewed from [facility]. Pt had CT max/fac today which negative for fx. CT head/c-spine yesterday which neg for fx or acute intracranial injury. Based on records provided, does not appear to have had repeat head CT and c-spine today. Given interim trauma from scans yesterday will CT today.  MDM  40yM s/p fall. Extensive hematoma/contusion to R face. CT head/cspine neg for acute injury. Results of CT max/face reviewed from OSH which was preformed earlier today and negative as well. No change in baseline per parents. Plan prn pain meds and fu as needed.        Raeford Razor, MD 04/23/11 [phone]

## 2011-04-20 NOTE — ED Notes (Signed)
Patient discharged with mother and dad.

## 2011-04-25 ENCOUNTER — Ambulatory Visit (INDEPENDENT_AMBULATORY_CARE_PROVIDER_SITE_OTHER): Payer: PRIVATE HEALTH INSURANCE | Admitting: Family Medicine

## 2011-04-25 VITALS — BP 120/80 | HR 86

## 2011-04-25 DIAGNOSIS — S199XXA Unspecified injury of neck, initial encounter: Secondary | ICD-10-CM

## 2011-04-25 DIAGNOSIS — S0993XA Unspecified injury of face, initial encounter: Secondary | ICD-10-CM

## 2011-04-25 DIAGNOSIS — G809 Cerebral palsy, unspecified: Secondary | ICD-10-CM

## 2011-04-25 NOTE — Progress Notes (Signed)
  Subjective:    Patient ID: Joel Dudley, male    DOB: [birth year], 40 y.o.   MRN: [id number]  HPI He is here for recheck after recent visit to the hospital for head trauma. He apparently fell while taking a shower. He was seen multiple times in the emergency room for head trauma. Some of trauma her bowels on him actually hitting his head on the floor. He has not articulated this to exactly why he did this. His mother speculates that he is quite upset over what occurred approximately 4 years ago while at [employer]. Apparently they have asked both him and his mother to leave the program. He was quite happy there and had several friends including the teacher.   Review of Systems     Objective:   Physical Exam Alert and in no distress. They shows extensive ecchymotic damage as well as some conjunctival hemorrhaging bilaterally. He also has ecchymotic anterior chest.       Assessment & Plan:   1. Facial trauma   2. CP (cerebral palsy)    recommended heat and anti-inflammatory of choice for the facial, and discomfort. We also had a very long conversation concerning why he is so upset. He did break down and cry. His mother has arranged for him to be involved in many activities however apparently this has not the same as what he was used to. I gave him my e-mail address in case he wants to articulate more without his mother being involved. Also encouraged him to express his feelings through his art work. Over 45 minutes spent discussing all these issues with him and his mother

## 2011-05-11 ENCOUNTER — Telehealth: Payer: Self-pay | Admitting: Family Medicine

## 2011-05-17 NOTE — Telephone Encounter (Signed)
TSD  

## 2011-05-23 ENCOUNTER — Ambulatory Visit (INDEPENDENT_AMBULATORY_CARE_PROVIDER_SITE_OTHER): Payer: PRIVATE HEALTH INSURANCE | Admitting: Family Medicine

## 2011-05-23 ENCOUNTER — Encounter: Payer: Self-pay | Admitting: Family Medicine

## 2011-05-23 DIAGNOSIS — F329 Major depressive disorder, single episode, unspecified: Secondary | ICD-10-CM

## 2011-05-23 DIAGNOSIS — G801 Spastic diplegic cerebral palsy: Secondary | ICD-10-CM

## 2011-05-23 DIAGNOSIS — G809 Cerebral palsy, unspecified: Secondary | ICD-10-CM

## 2011-05-23 DIAGNOSIS — F32A Depression, unspecified: Secondary | ICD-10-CM | POA: Insufficient documentation

## 2011-05-23 MED ORDER — LORAZEPAM 1 MG PO TABS: 1.0000 mg | ORAL_TABLET | Freq: Three times a day (TID) | ORAL | Status: DC

## 2011-05-23 MED ORDER — RISPERIDONE 1 MG PO TABS: 1.0000 mg | ORAL_TABLET | Freq: Two times a day (BID) | ORAL | Status: DC

## 2011-05-23 NOTE — Progress Notes (Signed)
  Subjective:    Patient ID: Joel Dudley, male    DOB: 06-19-1970, 41 y.o.   MRN: [id number]  HPI He is here for a followup visit. He was recently seen in the hospital for auscultation concerning some abnormal behavior and headbutting. He did injure his head on several occasions. His cerebral palsy makes it difficult to get a good history but apparently this revolves around a program he was involved in at [employer] he is no longer able to attend. This has him quite upset. Presently he is on respirdone and Ativan which does seem to make him more calm. He currently still is acting out making comments to his mother that if things don't go his way he once hit his head.   Review of Systems     Objective:   Physical Exam Alert and in no distress and relatively calm appearing.       Assessment & Plan:   1. CP (cerebral palsy), spastic   2. Depression    T1 present medications. We will attempt to find a therapist to [place] from prior with cerebral palsy and make an appointment. Otherwise recheck with me in one or 2 months.

## 2011-05-25 ENCOUNTER — Telehealth: Payer: Self-pay | Admitting: Family Medicine

## 2011-05-25 NOTE — Telephone Encounter (Signed)
Called [name], left message to call back.  I have made contact with Migel at [facility], they replace [facility].  Located [address] [place] beside [employer] account executive  [phone].  The have a walk in Mon - Fri from 8 - 3  For your 1st time visit.  They have therapist, dr and nurses on staff.  Bring pt id and insurance.  I have also spoken with [facility] [phone] [facility] she will check and call us back.  I have also emailed Mancel Parsons Case worker for [employer], waiting for her call back.

## 2011-05-30 NOTE — Telephone Encounter (Signed)
Pt mom called and I gave her the info that I had.

## 2011-06-18 ENCOUNTER — Telehealth: Payer: Self-pay | Admitting: Family Medicine

## 2011-06-19 ENCOUNTER — Encounter: Payer: Self-pay | Admitting: Family Medicine

## 2011-06-19 ENCOUNTER — Ambulatory Visit (INDEPENDENT_AMBULATORY_CARE_PROVIDER_SITE_OTHER): Payer: PRIVATE HEALTH INSURANCE | Admitting: Family Medicine

## 2011-06-19 VITALS — BP 120/70 | HR 73

## 2011-06-19 DIAGNOSIS — IMO0001 Reserved for inherently not codable concepts without codable children: Secondary | ICD-10-CM

## 2011-06-19 DIAGNOSIS — R259 Unspecified abnormal involuntary movements: Secondary | ICD-10-CM

## 2011-06-19 DIAGNOSIS — G809 Cerebral palsy, unspecified: Secondary | ICD-10-CM

## 2011-06-19 LAB — CBC WITH DIFFERENTIAL/PLATELET
Basophils Absolute: 0.1 10*3/uL (ref 0.0–0.1)
Basophils Relative: 1 % (ref 0–1)
Eosinophils Relative: 1 % (ref 0–5)
HCT: 41.5 % (ref 39.0–52.0)
MCHC: 34.2 g/dL (ref 30.0–36.0)
MCV: 85.9 fL (ref 78.0–100.0)
Monocytes Absolute: 0.8 10*3/uL (ref 0.1–1.0)
Monocytes Relative: 10 % (ref 3–12)
RDW: 12.3 % (ref 11.5–15.5)

## 2011-06-19 LAB — COMPREHENSIVE METABOLIC PANEL
AST: 18 U/L (ref 0–37)
Alkaline Phosphatase: 73 U/L (ref 39–117)
BUN: 10 mg/dL (ref 6–23)
Calcium: 9.2 mg/dL (ref 8.4–10.5)
Chloride: 105 mEq/L (ref 96–112)
Creat: 0.89 mg/dL (ref 0.50–1.35)
Glucose, Bld: 93 mg/dL (ref 70–99)

## 2011-06-19 NOTE — Telephone Encounter (Signed)
FYI ONLY

## 2011-06-19 NOTE — Progress Notes (Signed)
  Subjective:    Patient ID: Joel Dudley, male    DOB: July 07, 1970, 41 y.o.   MRN: [id number]  HPI His mother called yesterday stating that he was again having difficulty with shaking and banging his head. When she asks him why he does the head-banging he says because he likes to do it. He was seen recently and given Ativan as well as a Rispirdal however she stopped this the other night.   Review of Systems     Objective:   Physical Exam Alert and in no distress. No obvious, noted. Heart and lung exam normal. Abdominal exam normal. No tremors were noted.       Assessment & Plan:  Etiology of his symptoms is unclear. I did recommend using 2 mg of Risperdal at night to see if this will help him if it makes his symptoms worse, use Ativan and let me know. Routine blood screening will also be done today. She currently does have a scheduled appointment with a psychiatrist at [facility].

## 2011-06-19 NOTE — Patient Instructions (Signed)
Take 2 of the respiratory and see what that does for the sleeping tonight and if you need to you can use the lorazepam

## 2011-06-20 NOTE — Progress Notes (Signed)
Quick Note:  The blood work is normal ______ 

## 2011-06-21 ENCOUNTER — Telehealth: Payer: Self-pay | Admitting: Internal Medicine

## 2011-06-21 NOTE — Telephone Encounter (Signed)
THEY SAID THEY HAVE FAXED OVER A PAPER AND ALL NEEDS TO BE DONE IS DR.LALONDE TO [facility]

## 2011-06-21 NOTE — Telephone Encounter (Signed)
CALLED [facility] TO SEE WHAT WE NEEDED TO

## 2011-06-21 NOTE — Telephone Encounter (Signed)
Work with her on this and I will fill out the appropriate paperwork or referral.

## 2011-06-21 NOTE — Telephone Encounter (Signed)
Joel [place] called in regards to [place] about a new electric wheel chair that he needs. The other one he has is more than 39 years old and the whole axle is broke. however [facility] said they would need a evaluation done before he is able to get a new wheel chair. You may call Joel to discuss this if any problems.

## 2011-06-25 ENCOUNTER — Other Ambulatory Visit: Payer: Self-pay | Admitting: Family Medicine

## 2011-06-25 ENCOUNTER — Other Ambulatory Visit: Payer: Self-pay

## 2011-06-25 MED ORDER — LORAZEPAM 1 MG PO TABS: 1.0000 mg | ORAL_TABLET | Freq: Three times a day (TID) | ORAL | Status: DC

## 2011-06-25 NOTE — Telephone Encounter (Signed)
Is this ok?

## 2011-06-25 NOTE — Telephone Encounter (Signed)
Called med in 

## 2011-07-29 ENCOUNTER — Other Ambulatory Visit: Payer: Self-pay | Admitting: Family Medicine

## 2011-07-30 ENCOUNTER — Telehealth: Payer: Self-pay | Admitting: Internal Medicine

## 2011-07-30 ENCOUNTER — Other Ambulatory Visit: Payer: Self-pay

## 2011-07-30 MED ORDER — LORAZEPAM 1 MG PO TABS: 1.0000 mg | ORAL_TABLET | Freq: Three times a day (TID) | ORAL | Status: DC

## 2011-07-30 NOTE — Telephone Encounter (Signed)
Its [place].

## 2011-07-30 NOTE — Telephone Encounter (Signed)
CALLED MED IN  

## 2011-07-30 NOTE — Telephone Encounter (Signed)
I informed Joel Dudley that would be the most advantageous to call the hospital that he was in in [place] and get the mental health/social worker involved with sending him to [place]. Apparently this was a hospital that would take him with his underlying handicap. Explained her that switching facilities would only delay things and make it much more difficult for him to get help that he needs

## 2011-07-30 NOTE — Telephone Encounter (Signed)
Renew this 

## 2011-07-30 NOTE — Telephone Encounter (Signed)
Is this ok?

## 2011-07-30 NOTE — Telephone Encounter (Signed)
Joel Dudley, Chi's mom called stating that Joel Dudley is crying all the time and throwing himself backwards and keeps saying he wants to go to the hospital and Joel wants to know if you can refer or set up for him to go to a mental hospital in [place] and she would take him down there. She would like to talk to you about this situation if you would give her a call please.

## 2011-07-31 ENCOUNTER — Telehealth: Payer: Self-pay | Admitting: Internal Medicine

## 2011-07-31 NOTE — Telephone Encounter (Signed)
Mom called back and stated with jeffery continuing to bang his he she took him to mental health [facility] [name] is a Quarry manager there she cant have a appt for [name] to see Psychiatrist for psych.eval to send him to psyc. hospital till there is a head ct done to make sure the is nothing else going on he had 3 in Dec but they will not except that

## 2011-07-31 NOTE — Telephone Encounter (Signed)
Renewed his Xanax. I left a message stating I need more data before I can order a CT scan

## 2011-07-31 NOTE — Telephone Encounter (Signed)
Called and renewed ativan yesterday talked with Dr.Lalonde he said ok no xanax

## 2011-07-31 NOTE — Telephone Encounter (Signed)
crystal called stating that she did what you told her to do and that they sent him home and did not do anything for him and they told her that she needed to take him to a hosptial where they had a mental pychlogical place where they could help him.

## 2011-07-31 NOTE — Telephone Encounter (Signed)
[name] left message on voice mail that she took Thurston to [facility] and they want head scan.  Please call [facility] and order another head scan.  Also [name] request Xanax for pt during the day.  [name] can be reached at [phone]

## 2011-08-01 NOTE — Telephone Encounter (Signed)
I phoned [facility] [phone] option 1 for scheduling X2 for CT spoke with Synetta Fail, Set pt up for CT of head on Monday April 1 at 11:00 at the out pt center of the hospital, advised mom.

## 2011-08-01 NOTE — Telephone Encounter (Signed)
Mom called back, she needs something for patient during the day.  The Ativan works at night and makes him sleepy, but she needs rx for him during the day to make him happy.  I also advised Mom that  I will be working on referral for CT of head at [facility].

## 2011-08-02 ENCOUNTER — Telehealth: Payer: Self-pay | Admitting: Family Medicine

## 2011-08-02 NOTE — Telephone Encounter (Signed)
I called [name] last night and again this morning.  Dr. Susann Givens wants to know if Joel [place]ley is taking his Repiradal twice a day.  If not he needs to do that.  Also if he is then [name] can take 2 in the am and 1 in the pm  [facility].

## 2011-08-06 ENCOUNTER — Telehealth: Payer: Self-pay | Admitting: Family Medicine

## 2011-08-06 MED ORDER — CITALOPRAM HYDROBROMIDE 20 MG PO TABS: 20.0000 mg | ORAL_TABLET | Freq: Every day | ORAL | Status: DC

## 2011-08-06 NOTE — Telephone Encounter (Signed)
The Risperdal has not helped with his overall feeling. I will have his mother stopped that. We'll start him on lCelexa. She may continue to use Ativan as needed.

## 2011-08-06 NOTE — Telephone Encounter (Signed)
Joel [place]ley called and stated that Joel Dudley is "going crazy" and is having bad headaches. She wants to know if it could be some of the meds he is taking. Please call Joel Dudley.

## 2011-08-07 ENCOUNTER — Telehealth: Payer: Self-pay

## 2011-08-07 NOTE — Telephone Encounter (Signed)
NO that the CT scan is negative. Find out from her home at [facility] we need to send this to

## 2011-08-07 NOTE — Telephone Encounter (Signed)
Mom called asking for results I think the test are on your desk

## 2011-08-07 NOTE — Telephone Encounter (Signed)
Mom informed faxed ct to [facility] in [place] [phone] number [phone]

## 2011-08-22 ENCOUNTER — Encounter: Payer: Self-pay | Admitting: Family Medicine

## 2011-08-23 ENCOUNTER — Ambulatory Visit (INDEPENDENT_AMBULATORY_CARE_PROVIDER_SITE_OTHER): Payer: PRIVATE HEALTH INSURANCE | Admitting: Family Medicine

## 2011-08-23 DIAGNOSIS — G809 Cerebral palsy, unspecified: Secondary | ICD-10-CM

## 2011-08-23 DIAGNOSIS — G808 Other cerebral palsy: Secondary | ICD-10-CM | POA: Insufficient documentation

## 2011-08-23 DIAGNOSIS — R279 Unspecified lack of coordination: Secondary | ICD-10-CM

## 2011-08-23 NOTE — Progress Notes (Signed)
  Subjective:    Patient ID: Joel [place], male    DOB: 1970-11-28, 41 y.o.   MRN: [id number]  HPI He is here for consultation concerning need for power wheelchair. He has used a power wheelchair for approximately 20 years. Physical therapy note for medical necessity was reviewed. He is not appropriate for her regular wheelchair or a scooter due to his underlying tach of coordination Appropriate paperwork was filled out.   Review of Systems     Objective:   Physical Exam Alert and in no distress otherwise not examined       Assessment & Plan:   1. Infantile cerebral palsy, unspecified   2. Congenital quadriplegia   3. Lack of coordination    the appropriate paperwork was reviewed and I agree with it. It was signed and dated.

## 2011-08-28 ENCOUNTER — Telehealth: Payer: Self-pay | Admitting: Family Medicine

## 2011-08-28 ENCOUNTER — Other Ambulatory Visit: Payer: Self-pay

## 2011-08-28 ENCOUNTER — Other Ambulatory Visit: Payer: Self-pay | Admitting: Family Medicine

## 2011-08-28 NOTE — Telephone Encounter (Signed)
Go ahead and phoned this in

## 2011-08-28 NOTE — Telephone Encounter (Signed)
Is this ok?

## 2011-08-28 NOTE — Telephone Encounter (Signed)
Called med in per jcl 

## 2011-08-28 NOTE — Telephone Encounter (Signed)
His mother has concerns over the correct medication regimen. I reviewed the record and he is to be on  Celexa and Ativan

## 2011-08-28 NOTE — Telephone Encounter (Signed)
Med called in

## 2011-09-03 ENCOUNTER — Ambulatory Visit (INDEPENDENT_AMBULATORY_CARE_PROVIDER_SITE_OTHER): Payer: PRIVATE HEALTH INSURANCE | Admitting: Family Medicine

## 2011-09-03 ENCOUNTER — Encounter: Payer: Self-pay | Admitting: Family Medicine

## 2011-09-03 DIAGNOSIS — R079 Chest pain, unspecified: Secondary | ICD-10-CM

## 2011-09-03 NOTE — Progress Notes (Signed)
  Subjective:    Patient ID: Joel [place], male    DOB: 05/07/1971, 41 y.o.   MRN: [id number]  HPI Was apparently having difficulty with chest pain while on Risperdal. His psychiatrist wanted him evaluated.   Review of Systems     Objective:   Physical Exam Alert and in no distress. Cardiac exam shows regular rhythm without murmurs or gallops. EKG shows no acute changes.       Assessment & Plan:   1. Chest pain    I reassured them that I saw no cardiac issues.

## 2011-09-23 ENCOUNTER — Other Ambulatory Visit: Payer: Self-pay | Admitting: Family Medicine

## 2011-09-24 ENCOUNTER — Other Ambulatory Visit: Payer: Self-pay

## 2011-09-24 MED ORDER — LORAZEPAM 1 MG PO TABS: 1.0000 mg | ORAL_TABLET | Freq: Three times a day (TID) | ORAL | Status: DC | PRN

## 2011-09-24 NOTE — Telephone Encounter (Signed)
IS THIS OK 

## 2011-09-24 NOTE — Telephone Encounter (Signed)
Go ahead and renew this 

## 2011-09-24 NOTE — Telephone Encounter (Signed)
CALLED ATIVAN IN PER JCL

## 2011-10-19 ENCOUNTER — Other Ambulatory Visit: Payer: Self-pay | Admitting: Family Medicine

## 2011-10-22 ENCOUNTER — Other Ambulatory Visit: Payer: Self-pay

## 2011-10-22 MED ORDER — LORAZEPAM 1 MG PO TABS: 1.0000 mg | ORAL_TABLET | Freq: Three times a day (TID) | ORAL | Status: DC | PRN

## 2011-10-22 NOTE — Telephone Encounter (Signed)
Med called in per jcl 

## 2011-10-22 NOTE — Telephone Encounter (Signed)
Is this ok?

## 2011-10-22 NOTE — Telephone Encounter (Signed)
Go ahead and renew this 

## 2011-10-26 ENCOUNTER — Encounter: Payer: Self-pay | Admitting: Medical

## 2011-10-26 ENCOUNTER — Other Ambulatory Visit: Payer: Self-pay | Admitting: Medical

## 2011-10-26 ENCOUNTER — Ambulatory Visit (INDEPENDENT_AMBULATORY_CARE_PROVIDER_SITE_OTHER): Payer: PRIVATE HEALTH INSURANCE | Admitting: Medical

## 2011-10-26 VITALS — BP 122/80 | HR 64 | Temp 97.7°F | Resp 16

## 2011-10-26 DIAGNOSIS — B353 Tinea pedis: Secondary | ICD-10-CM

## 2011-10-26 DIAGNOSIS — D492 Neoplasm of unspecified behavior of bone, soft tissue, and skin: Secondary | ICD-10-CM

## 2011-10-26 MED ORDER — ECONAZOLE NITRATE 1 % EX CREA: TOPICAL_CREAM | Freq: Every day | CUTANEOUS | Status: DC

## 2011-10-26 NOTE — Progress Notes (Signed)
Subjective: Here for 2 issues.  Here with mother who is his caretaker as he is wheelchair bound.  Mom notes a "tumor" between his left small toe and 4th toe that she just noted.  There is white tissue and a wound.  It is itchy.  No prior similar.  No redness, swelling or fever.   Using nothing on the area.  Mom notes that for a period of time now he has been irritated by a itchy mole on his left back that may be growing.   He pulls and scratches it often.  Mom wants it taken off.    Past Medical History  Diagnosis Date  . Allergy     RHINITIS  . Cerebral palsy   . MR (mental retardation)    Objective: Gen: wd, wn, white male seated in wheelchair, cooperative, no distress Skin: left foot between 4-5th toes with macerated white tissue, thickened keratinous tissue, suggestive of tinea pedis.    Left lower back laterally with 2mm x 2mm raised pink papular lesion, but no erythema, crusting, induration or fluctuance.    Assessment: Encounter Diagnoses  Name Primary?  . Neoplasm of skin of back Yes  . Tinea pedis    Plan: Neoplasm - advised hydrocortisone to help with itching, but mom would rather the lesion be removed. Discussed risks/benefits of procedure.  Used 1% lidocaine without epi for local anesthesia.  Used flexible shave blade for shave biopsy, used direct pressure and silver nitrate stick for hemostasis.  Pt tolerated procedure well.  Discussed wound care.  Sent lesion to pathology which appears to be benign.   Tinea - begin course of Econazole cream, keep area clean, try and leave sock off some to keep it dry.  Call or return if not improving in 1-2 wk

## 2011-10-30 ENCOUNTER — Other Ambulatory Visit: Payer: Self-pay | Admitting: Medical

## 2011-10-30 ENCOUNTER — Telehealth: Payer: Self-pay | Admitting: Family Medicine

## 2011-10-30 MED ORDER — KETOCONAZOLE 2 % EX CREA: TOPICAL_CREAM | Freq: Every day | CUTANEOUS | Status: DC

## 2011-10-30 NOTE — Telephone Encounter (Signed)
BOTH NUMBERS AREN'T [facility]

## 2011-10-30 NOTE — Telephone Encounter (Signed)
Changed to ketoconazole cream.  I already sent to pharmacy.  Let mom know.

## 2011-10-30 NOTE — Telephone Encounter (Signed)
DO YOU WANT TO SWITCH?  PREFERRED DRUG LIST ON CHART-LM

## 2011-10-31 NOTE — Telephone Encounter (Signed)
LM

## 2011-11-21 ENCOUNTER — Other Ambulatory Visit: Payer: Self-pay | Admitting: Family Medicine

## 2011-11-22 ENCOUNTER — Other Ambulatory Visit: Payer: Self-pay

## 2011-11-22 MED ORDER — LORAZEPAM 1 MG PO TABS: 1.0000 mg | ORAL_TABLET | Freq: Three times a day (TID) | ORAL | Status: DC | PRN

## 2011-11-22 NOTE — Telephone Encounter (Signed)
Go ahead and call in the lorazepam

## 2011-11-22 NOTE — Telephone Encounter (Signed)
Called med in 

## 2011-11-22 NOTE — Telephone Encounter (Signed)
IS THIS OK 

## 2011-12-22 ENCOUNTER — Other Ambulatory Visit: Payer: Self-pay | Admitting: Family Medicine

## 2011-12-24 NOTE — Telephone Encounter (Signed)
Let his mother know that I called the medicine in

## 2011-12-24 NOTE — Telephone Encounter (Signed)
Is this ok?

## 2011-12-24 NOTE — Telephone Encounter (Signed)
Left message on cell med had been called in

## 2012-01-31 ENCOUNTER — Encounter: Payer: Self-pay | Admitting: *Deleted

## 2012-02-01 ENCOUNTER — Telehealth: Payer: Self-pay | Admitting: Family Medicine

## 2012-02-01 NOTE — Telephone Encounter (Signed)
Find out exactly what I need to write for and write it up

## 2012-02-04 NOTE — Telephone Encounter (Signed)
Written and mailed

## 2012-02-04 NOTE — Telephone Encounter (Signed)
Left message for her to call me back to let me know exactly what she needs written

## 2012-02-22 ENCOUNTER — Telehealth: Payer: Self-pay | Admitting: Family Medicine

## 2012-02-22 ENCOUNTER — Other Ambulatory Visit: Payer: Self-pay | Admitting: Family Medicine

## 2012-02-22 MED ORDER — LORAZEPAM 1 MG PO TABS: 1.0000 mg | ORAL_TABLET | Freq: Four times a day (QID) | ORAL | Status: DC | PRN

## 2012-02-22 NOTE — Telephone Encounter (Signed)
Renew the medicine 

## 2012-02-22 NOTE — Telephone Encounter (Signed)
Is this ok?

## 2012-02-22 NOTE — Telephone Encounter (Signed)
Med called in

## 2012-02-22 NOTE — Telephone Encounter (Signed)
Have him schedule an appointment for followup on this in the next several months

## 2012-04-10 ENCOUNTER — Encounter: Payer: Self-pay | Admitting: Internal Medicine

## 2012-05-14 ENCOUNTER — Telehealth: Payer: Self-pay | Admitting: Family Medicine

## 2012-05-14 NOTE — Telephone Encounter (Signed)
Called and left message need to know exactly what RX needs to say

## 2012-05-14 NOTE — Telephone Encounter (Signed)
[name] called back said new ramp for [place] for electric wheel chair mailed rx to her

## 2012-05-14 NOTE — Telephone Encounter (Signed)
PT'S MOTHER CALLED AND STATED THAT THE RAMP FOR Eulises'S VAN WAS BROKE. IT IS IN THE SHOP NOW. SHE STATES THAT IN ORDER FOR HIS INSURANCE TO PAY FOR IT A PRESCRIPTION NEEDS TO BE WRITTEN. PT'S MOTHER IS REQUESTING THAT A RX BE WRITTEN AND MAILED TO HER.

## 2012-05-14 NOTE — Telephone Encounter (Signed)
Work with Gilmar's mother on exactly what to say in the note.

## 2012-06-10 ENCOUNTER — Telehealth: Payer: Self-pay | Admitting: Internal Medicine

## 2012-06-11 NOTE — Telephone Encounter (Signed)
Please, set up per Dr. Susann Givens.

## 2012-06-11 NOTE — Telephone Encounter (Signed)
Please set this up

## 2012-06-12 ENCOUNTER — Telehealth: Payer: Self-pay | Admitting: Internal Medicine

## 2012-06-12 NOTE — Telephone Encounter (Signed)
error 

## 2012-06-12 NOTE — Telephone Encounter (Signed)
Mom called back and states it was [facility]

## 2012-06-12 NOTE — Telephone Encounter (Signed)
CALLED MOM TO FIND OUT WHO SHE WANTS PT DONE WITH LEFT MESSAGE TO CALL ME BACK AND LET ME KNOW

## 2012-06-13 ENCOUNTER — Other Ambulatory Visit: Payer: Self-pay

## 2012-06-13 DIAGNOSIS — Z139 Encounter for screening, unspecified: Secondary | ICD-10-CM

## 2012-06-22 ENCOUNTER — Other Ambulatory Visit: Payer: Self-pay | Admitting: Family Medicine

## 2012-06-23 ENCOUNTER — Other Ambulatory Visit: Payer: Self-pay

## 2012-06-23 MED ORDER — LORAZEPAM 1 MG PO TABS: 1.0000 mg | ORAL_TABLET | Freq: Four times a day (QID) | ORAL | Status: DC | PRN

## 2012-06-23 NOTE — Telephone Encounter (Signed)
Okay to renew

## 2012-06-23 NOTE — Telephone Encounter (Signed)
CALLED LORAZAPAM IN PER JCL

## 2012-06-23 NOTE — Telephone Encounter (Signed)
IS THIS OK 

## 2012-06-30 ENCOUNTER — Telehealth: Payer: Self-pay | Admitting: Family Medicine

## 2012-06-30 NOTE — Telephone Encounter (Signed)
Called [facility] neuro pt/ot they are calling his mom

## 2012-07-01 ENCOUNTER — Telehealth: Payer: Self-pay | Admitting: Family Medicine

## 2012-07-01 NOTE — Telephone Encounter (Signed)
I called the supervisor at [facility] [phone] and spoke with Belenda Cruise.  She apologized and advised that they normally reach out to a pt within same week.  This has not happened for several reason, illness in staff, snow, etc.  I explained to Belenda Cruise that this patient needed to be contacted asap.  They will call her today and call me back after speaking with mom.

## 2012-07-02 ENCOUNTER — Telehealth: Payer: Self-pay | Admitting: Family Medicine

## 2012-07-02 NOTE — Telephone Encounter (Signed)
Angie with [facility] called and said she had left a message with [name] yesterday on [phone] and the [phone] is disconnected.  Also gave NPI for [facility].

## 2012-07-03 ENCOUNTER — Ambulatory Visit: Payer: PRIVATE HEALTH INSURANCE | Admitting: Physical Therapy

## 2012-07-03 ENCOUNTER — Telehealth: Payer: Self-pay | Admitting: Family Medicine

## 2012-07-03 NOTE — Telephone Encounter (Signed)
Received a voice mail message that pt has been scheduled for 2/28 at 1:00 p.m.for eval.

## 2012-07-04 ENCOUNTER — Ambulatory Visit: Payer: PRIVATE HEALTH INSURANCE | Attending: Family Medicine | Admitting: Physical Therapy

## 2012-07-04 DIAGNOSIS — IMO0001 Reserved for inherently not codable concepts without codable children: Secondary | ICD-10-CM | POA: Insufficient documentation

## 2012-07-04 DIAGNOSIS — M6281 Muscle weakness (generalized): Secondary | ICD-10-CM | POA: Insufficient documentation

## 2012-07-07 ENCOUNTER — Telehealth: Payer: Self-pay | Admitting: Family Medicine

## 2012-07-07 NOTE — Telephone Encounter (Signed)
CALLED MOM TO LET HER KNOW THAT STOPPING LARAZ.AND CELEXIA HE NEEDED TO GO BACK ON

## 2012-07-07 NOTE — Telephone Encounter (Signed)
She can stop the lorazepam but continue with Celexa.stopping that too quickly can cause difficulty.

## 2012-07-07 NOTE — Telephone Encounter (Signed)
Mom called and wants to just stop the celexa and wants to know what kind of difficulty he will have. She wants to know exactly what kind of effects it can do. She has not given it to him in 2 days and he is happy as can be. She doesn't want him to be weaned off of this med.

## 2012-07-12 IMAGING — CT CT CERVICAL SPINE W/O CM
2 of 9 series · 5 of 20 positions shown, 6 images · IV contrast (agent unspecified)
Comparison: 08/09/2010

CLINICAL DATA: Fall.  Right-sided face and scalp hematoma.
Headache and blurred vision.  The patient also fell yesterday.

CT HEAD WITHOUT CONTRAST,CT CERVICAL SPINE WITHOUT CONTRAST
CT OF THE HEAD WITHOUT CONTRAST:
TECHNIQUE: Contiguous axial images were obtained from the base of
the skull through the vertex without contrast.,Technique:
Multidetector CT imaging of the cervical spine was performed.
Multiplanar CT image reconstructions were also generated.
TECHNIQUE: Multidetector CT imaging of the cervical spine was
performed without intravenous contrast.  Multiplanar CT image
reconstructions were also generated.

[Series 901: cor · coronal · 0.35mm/px · 3 of 48 slices shown]
[im 10/48  bone]
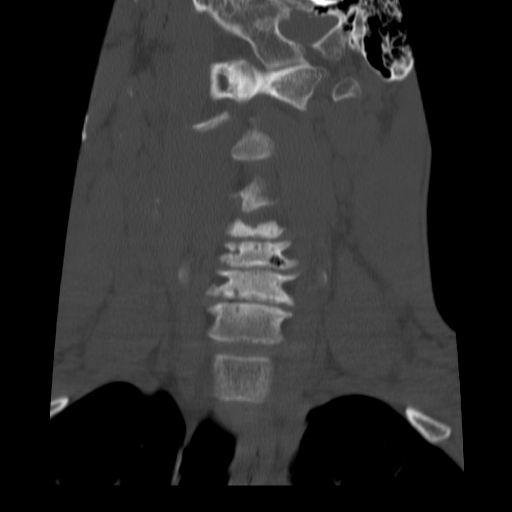
[im 19/48  bone]
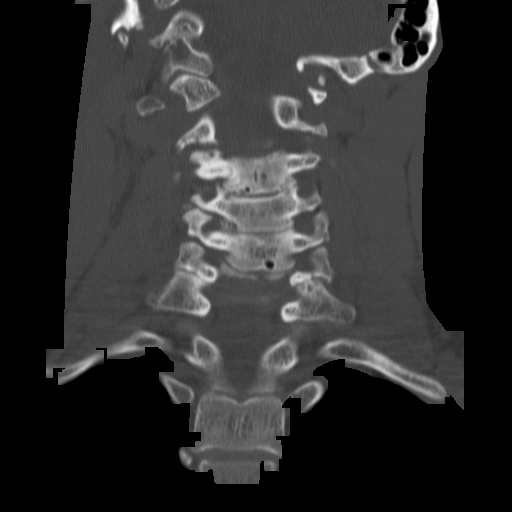
[im 29/48  bone]
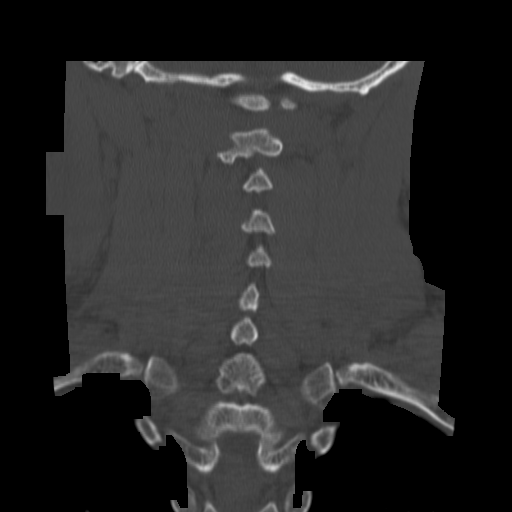

[Series 902: orthog · axial · 0.35mm/px · z∈[+17,+218]mm · 2 of 87 slices shown, 3 images]
[im 1/87  soft-tissue]
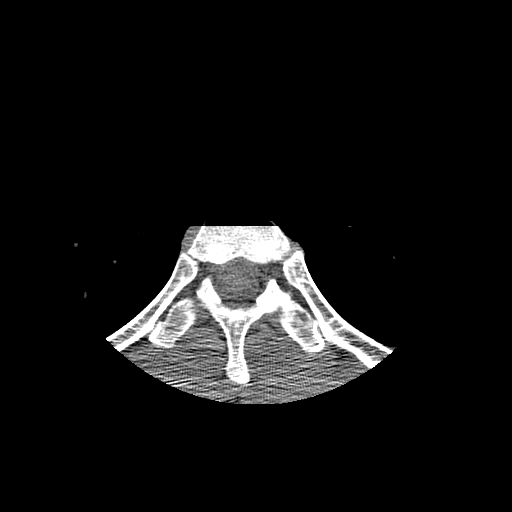
[im 1/87  bone]
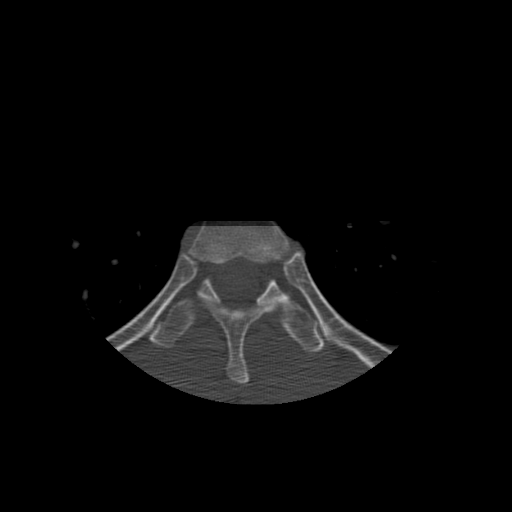
[im 87/87  bone]
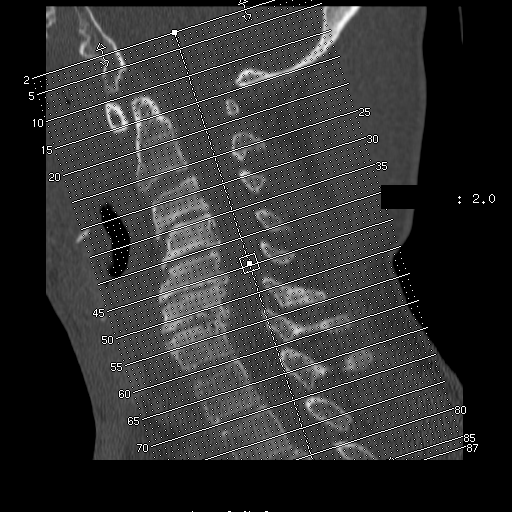

[5 of 20 positions shown; findings below may reference images not displayed]

FINDINGS: There is no intra or extra-axial fluid collection or
mass.  The basilar cisterns and ventricles have a normal
appearance.  There is extensive soft tissue swelling involving the
frontal, right temporal and parietal scalp, extending to involve
the occipital scalp.  No underlying calvarial fracture. Patient
motion artifact is present.
IMPRESSION: 1.  Extensive scalp edema.
2.  No evidence for fracture.
3. No evidence for acute intracranial abnormality.

CT CERVICAL SPINE WITHOUT CONTRAST:
FINDINGS: There is moderate degenerative change within the
cervical spine.  Changes are most notable at C3-4, C4-5, C5-6, and
C6-7.  There is no evidence for acute fracture or subluxation.
Lung apices are clear.
IMPRESSION: 1.  Degenerative changes in the cervical spine.
2.  No evidence for acute abnormality.

## 2012-07-14 ENCOUNTER — Ambulatory Visit: Payer: PRIVATE HEALTH INSURANCE | Admitting: Physical Therapy

## 2012-07-15 ENCOUNTER — Telehealth: Payer: Self-pay | Admitting: Family Medicine

## 2012-07-18 NOTE — Telephone Encounter (Signed)
fiy 

## 2012-07-31 ENCOUNTER — Ambulatory Visit: Payer: PRIVATE HEALTH INSURANCE | Attending: Family Medicine | Admitting: Physical Therapy

## 2012-07-31 DIAGNOSIS — IMO0001 Reserved for inherently not codable concepts without codable children: Secondary | ICD-10-CM | POA: Insufficient documentation

## 2012-07-31 DIAGNOSIS — M6281 Muscle weakness (generalized): Secondary | ICD-10-CM | POA: Insufficient documentation

## 2012-09-11 ENCOUNTER — Ambulatory Visit (INDEPENDENT_AMBULATORY_CARE_PROVIDER_SITE_OTHER): Payer: PRIVATE HEALTH INSURANCE | Admitting: Family Medicine

## 2012-09-11 ENCOUNTER — Encounter: Payer: Self-pay | Admitting: Family Medicine

## 2012-09-11 VITALS — BP 110/72

## 2012-09-11 DIAGNOSIS — L299 Pruritus, unspecified: Secondary | ICD-10-CM

## 2012-09-11 NOTE — Patient Instructions (Signed)
Use the over-the-counter cortisone cream

## 2012-09-11 NOTE — Progress Notes (Signed)
  Subjective:    Patient ID: Joel Dudley, male    DOB: 1971/01/24, 42 y.o.   MRN: [id number]  HPI He is brought in by his mother for evaluation of complaint of itching in his feet. There've been no blisters or exposure to any new chemicals.   Review of Systems     Objective:   Physical Exam Exam of both feet show some slight abrasion present on the right but none on the left. It is not hot or tender no lesions were noted.       Assessment & Plan:  Pruritus recommend conservative care with cortisone cream. Return if continued difficulty.

## 2012-10-23 ENCOUNTER — Encounter: Payer: PRIVATE HEALTH INSURANCE | Admitting: Family Medicine

## 2012-10-27 ENCOUNTER — Encounter: Payer: Self-pay | Admitting: Internal Medicine

## 2012-10-28 ENCOUNTER — Ambulatory Visit (INDEPENDENT_AMBULATORY_CARE_PROVIDER_SITE_OTHER): Payer: PRIVATE HEALTH INSURANCE | Admitting: Family Medicine

## 2012-10-28 ENCOUNTER — Encounter: Payer: Self-pay | Admitting: Family Medicine

## 2012-10-28 VITALS — BP 122/78 | HR 68

## 2012-10-28 DIAGNOSIS — G801 Spastic diplegic cerebral palsy: Secondary | ICD-10-CM

## 2012-10-28 DIAGNOSIS — G809 Cerebral palsy, unspecified: Secondary | ICD-10-CM

## 2012-10-28 DIAGNOSIS — Z0000 Encounter for general adult medical examination without abnormal findings: Secondary | ICD-10-CM

## 2012-10-28 DIAGNOSIS — G808 Other cerebral palsy: Secondary | ICD-10-CM

## 2012-10-28 LAB — CBC WITH DIFFERENTIAL/PLATELET
Basophils Absolute: 0 10*3/uL (ref 0.0–0.1)
Basophils Relative: 1 % (ref 0–1)
Eosinophils Absolute: 0.1 10*3/uL (ref 0.0–0.7)
Eosinophils Relative: 2 % (ref 0–5)
HCT: 39.1 % (ref 39.0–52.0)
Hemoglobin: 13.3 g/dL (ref 13.0–17.0)
Lymphocytes Relative: 28 % (ref 12–46)
Lymphs Abs: 1.7 10*3/uL (ref 0.7–4.0)
MCH: 27.9 pg (ref 26.0–34.0)
MCHC: 34 g/dL (ref 30.0–36.0)
MCV: 82.1 fL (ref 78.0–100.0)
Monocytes Absolute: 0.4 10*3/uL (ref 0.1–1.0)
Monocytes Relative: 7 % (ref 3–12)
Neutro Abs: 3.8 10*3/uL (ref 1.7–7.7)
Neutrophils Relative %: 62 % (ref 43–77)
Platelets: 218 10*3/uL (ref 150–400)
RBC: 4.76 MIL/uL (ref 4.22–5.81)
RDW: 13.8 % (ref 11.5–15.5)
WBC: 6.1 10*3/uL (ref 4.0–10.5)

## 2012-10-28 LAB — LIPID PANEL
LDL Cholesterol: 81 mg/dL (ref 0–99)
Triglycerides: 58 mg/dL (ref <150)
VLDL: 12 mg/dL (ref 0–40)

## 2012-10-28 LAB — COMPREHENSIVE METABOLIC PANEL
ALT: <8 U/L (ref 0–53)
Albumin: 3.6 g/dL (ref 3.5–5.2)
Alkaline Phosphatase: 76 U/L (ref 39–117)
CO2: 25 mEq/L (ref 19–32)
Glucose, Bld: 82 mg/dL (ref 70–99)
Potassium: 3.9 mEq/L (ref 3.5–5.3)
Sodium: 139 mEq/L (ref 135–145)
Total Bilirubin: 0.4 mg/dL (ref 0.3–1.2)
Total Protein: 6.3 g/dL (ref 6.0–8.3)

## 2012-10-28 NOTE — Progress Notes (Signed)
  Subjective:    Patient ID: Joel Dudley, male    DOB: November 12, 1970, 42 y.o.   MRN: [id number]  HPI He is here for a complete examination. He is now getting regular Botox shots to help with his spasticity and is working quite well. He states that he is only getting roughly 2 months for the benefit out of the injections. His mother is his caregiver and takes quite good care of him. He does have urinary incontinence and they seem to be handling this quite well. There are no other concerns or questions.   Review of Systems  Constitutional: Negative.   HENT: Negative.   Eyes: Negative.   Respiratory: Negative.   Cardiovascular: Negative.   Gastrointestinal: Negative.   Endocrine: Negative.   Genitourinary: Negative.   Allergic/Immunologic: Negative.   Neurological: Negative.   Hematological: Negative.   Psychiatric/Behavioral: Negative.        Objective:   Physical Exam BP 122/78  Pulse 68  General Appearance:    Alert, cooperative, no distress, appears stated age  Head:    Normocephalic, without obvious abnormality, atraumatic  Eyes:    PERRL, conjunctiva/corneas clear, EOM's intact, fundi    benign  Ears:    Normal TM's and external ear canals  Nose:   Nares normal, mucosa normal, no drainage or sinus   tenderness  Throat:   Lips, mucosa, and tongue normal; teeth and gums normal  Neck:   Supple, no lymphadenopathy;  thyroid:  no   enlargement/tenderness/nodules; no carotid   bruit or JVD  Back:    Spine nontender, no curvature, ROM normal, no CVA     tenderness  Lungs:     Clear to auscultation bilaterally without wheezes, rales or     ronchi; respirations unlabored  Chest Wall:    No tenderness or deformity   Heart:    Regular rate and rhythm, S1 and S2 normal, no murmur, rub   or gallop  Breast Exam:    No chest wall tenderness, masses or gynecomastia  Abdomen:     Soft, non-tender, nondistended, normoactive bowel sounds,    no masses, no hepatosplenomegaly  Genitalia:     Normal male external genitalia without lesions.  Testicles without masses.  No inguinal hernias.  Rectal:  deferred  Extremities:   No clubbing, cyanosis or edema  Pulses:   2+ and symmetric all extremities  Skin:   Skin color, texture, turgor normal, no rashes or lesions  Lymph nodes:   Cervical, supraclavicular, and axillary nodes normal  Neurologic:   CNII-XII intact, normal strength, sensation and gait; reflexes 2+ and symmetric throughout          Psych:   Normal mood, affect, hygiene and grooming.          Assessment & Plan:  Routine general medical examination at a health care facility - Plan: CBC with Differential, Comprehensive metabolic panel, Lipid panel  CP (cerebral palsy), spastic  Congenital quadriplegia

## 2012-10-29 NOTE — Progress Notes (Signed)
Quick Note:  The blood work is normal ______ 

## 2012-10-30 NOTE — Progress Notes (Signed)
Quick Note:    SENT LETTER.  ______

## 2012-11-14 ENCOUNTER — Encounter: Payer: Self-pay | Admitting: Family Medicine

## 2012-12-05 ENCOUNTER — Encounter: Payer: Self-pay | Admitting: Medical

## 2012-12-05 ENCOUNTER — Ambulatory Visit (INDEPENDENT_AMBULATORY_CARE_PROVIDER_SITE_OTHER): Payer: PRIVATE HEALTH INSURANCE | Admitting: Medical

## 2012-12-05 VITALS — BP 138/96 | HR 88 | Temp 98.3°F | Ht 60.0 in | Wt 129.0 lb

## 2012-12-05 DIAGNOSIS — L22 Diaper dermatitis: Secondary | ICD-10-CM

## 2012-12-05 DIAGNOSIS — K4090 Unilateral inguinal hernia, without obstruction or gangrene, not specified as recurrent: Secondary | ICD-10-CM

## 2012-12-05 DIAGNOSIS — R300 Dysuria: Secondary | ICD-10-CM

## 2012-12-05 MED ORDER — ZINC OXIDE 40 % EX OINT: TOPICAL_OINTMENT | CUTANEOUS | Status: DC | PRN

## 2012-12-05 MED ORDER — CIPROFLOXACIN HCL 500 MG PO TABS: 500.0000 mg | ORAL_TABLET | Freq: Two times a day (BID) | ORAL | Status: DC

## 2012-12-05 NOTE — Progress Notes (Signed)
Subjective: [name] is a 42yo male with hx/o MR and cerebal palsy, here with mother for c/o rash.  Last week had foot surgery, ended up having nausea and vomiting a few days then resolved.  Mom attributes this to norco he was given.   However, in the last few days she has seen warm red rash in his privates involving the penis.  No prior similar.  Did c/o pain with urination last week.   He has not had a fever.  He has hx/o right inguinal hernia, she wonders if this has ruptured.  Given his cerebal palsy, he urinates or has urine leakage numerous times daily.  Mom/caregiver is currently changing his diaper once daily.   Past Medical History  Diagnosis Date  . Allergy     RHINITIS  . Cerebral palsy   . MR (mental retardation)   . Spasm of muscle   . Contracture of joint of multiple sites   . Athetoid cerebral palsy   . Spasm of muscle   . Congenital quadriplegia    ROS as in subjective  Objective: Gen: wd, wn, nad, seated Skin: groin region/suprapubic and including penis with pink and erythematous irritated skin suggestive of diaper dermatitis, his diaper is currently saturated with urine, strong urine odor, but no obvious warmth, induration or fluctuance GU: penis also with pink irritated skin, but no phimosis, or warmth, induration or fluctuance, tests and scrotal seem normal, nontender, no mass, right small inguinal hernia seems to be present although exam is difficult.  No sign of strangulated or incarcerated hernia.   Assessment: Encounter Diagnoses  Name Primary?  . Dysuria Yes  . Diaper dermatitis   . Inguinal hernia    Plan: Begin cipro for suspected UTI.  desitin cream for rash/diaper dermatitis, advised more frequent diaper changes, as he is only getting changed once daily.  Discussed the possible hernia. No worrisome hernia findings today.  Return in 7-10 days if not improving . Unable to get urine specimen today.  patient attempted in room, but urine all ended up on the  floor.

## 2012-12-05 NOTE — Patient Instructions (Signed)
Begin Cipro oral antibiotic, twice daily x 10 days for possible urinary tract infection  Begin Desitin cream topically in the groin region, including the penis.  Change the diaper more frequently.  Keep his groin area dry.  If worse or not improving, recheck in 1 week, sooner as needed.

## 2013-02-09 ENCOUNTER — Ambulatory Visit (INDEPENDENT_AMBULATORY_CARE_PROVIDER_SITE_OTHER): Payer: PRIVATE HEALTH INSURANCE | Admitting: Family Medicine

## 2013-02-09 DIAGNOSIS — G809 Cerebral palsy, unspecified: Secondary | ICD-10-CM

## 2013-02-09 DIAGNOSIS — G801 Spastic diplegic cerebral palsy: Secondary | ICD-10-CM

## 2013-02-09 DIAGNOSIS — Z23 Encounter for immunization: Secondary | ICD-10-CM

## 2013-02-09 NOTE — Progress Notes (Signed)
  Subjective:    Patient ID: Joel Dudley, male    DOB: 05-25-1970, 42 y.o.   MRN: [id number]  HPI He is here for consult. She has been getting Botox injections which has helped with his spasticity. He also had toe surgery recently to fix a deformity. He is now ready to learn how to stand and get more physically strong. He also is having difficulty with his wheelchair. The right front wheel does not rotate properly. Also apparently the wheelchair lift for the plan is in need of repair. His mother has tried on several occasions to contact the caseworker in [place] but has been unsuccessful. He has a new caseworker who apparently started in April. Apparently she has, for a few visits so far has not responded to any requests for help for equipment.   Review of Systems     Objective:   Physical Exam Alert and in no distress. Exam of the right foot shows straightening of the great toe. No pain on palpation.       Assessment & Plan:  Need for prophylactic vaccination and inoculation against influenza - Plan: Flu Vaccine QUAD 36+ mos PF IM (Fluarix)  CP (cerebral palsy), spastic   Prescription written for physical therapy to evaluate for proper equipment to help him stand. I also discussed proper documentation of holding on to e-mails and the possibility of going to the ladies supervisor to get quicker action.

## 2013-02-20 ENCOUNTER — Telehealth: Payer: Self-pay | Admitting: Family Medicine

## 2013-02-23 ENCOUNTER — Other Ambulatory Visit: Payer: Self-pay

## 2013-02-23 DIAGNOSIS — R262 Difficulty in walking, not elsewhere classified: Secondary | ICD-10-CM

## 2013-02-23 NOTE — Telephone Encounter (Signed)
DR.LALONDE  I DO NOT KNOW ANYTHING ABOUT THIS REFERRAL I READ YOUR NOTE FROM THE APPOINTMENT DID YOU GIVE MOM THE WRITTEN RX PLEASE ADVISE

## 2013-02-23 NOTE — Telephone Encounter (Signed)
ORDER HAS BEEN PUT IN EPIC

## 2013-02-23 NOTE — Telephone Encounter (Signed)
Go ahead and make the referral to physical therapy to work with building up his strength to be able to walk again with crutches

## 2013-02-24 NOTE — Telephone Encounter (Signed)
Please take care of this.  

## 2013-02-24 NOTE — Telephone Encounter (Signed)
[name] calling back about referral for evaluation by PT for evaluation for a stander  She still has not received a call back from the office.  I see referral in system but I do not see an appointment scheduled  Please call

## 2013-02-25 ENCOUNTER — Other Ambulatory Visit: Payer: Self-pay

## 2013-02-25 DIAGNOSIS — R262 Difficulty in walking, not elsewhere classified: Secondary | ICD-10-CM

## 2013-02-25 NOTE — Telephone Encounter (Signed)
Had to call neuro p/t they have made the first appointment is 10/23 at 10:30 for a 11 o'clock crystal informed

## 2013-02-26 ENCOUNTER — Ambulatory Visit: Payer: PRIVATE HEALTH INSURANCE | Attending: Family Medicine | Admitting: Physical Therapy

## 2013-02-26 DIAGNOSIS — R262 Difficulty in walking, not elsewhere classified: Secondary | ICD-10-CM | POA: Insufficient documentation

## 2013-02-26 DIAGNOSIS — M6281 Muscle weakness (generalized): Secondary | ICD-10-CM | POA: Insufficient documentation

## 2013-02-26 DIAGNOSIS — IMO0001 Reserved for inherently not codable concepts without codable children: Secondary | ICD-10-CM | POA: Insufficient documentation

## 2013-03-05 ENCOUNTER — Ambulatory Visit: Payer: PRIVATE HEALTH INSURANCE | Admitting: Physical Therapy

## 2013-03-12 ENCOUNTER — Ambulatory Visit: Payer: PRIVATE HEALTH INSURANCE | Admitting: Physical Therapy

## 2013-03-18 ENCOUNTER — Ambulatory Visit: Payer: PRIVATE HEALTH INSURANCE | Admitting: Physical Therapy

## 2013-04-20 ENCOUNTER — Ambulatory Visit: Payer: PRIVATE HEALTH INSURANCE | Attending: Family Medicine | Admitting: Physical Therapy

## 2013-04-20 DIAGNOSIS — IMO0001 Reserved for inherently not codable concepts without codable children: Secondary | ICD-10-CM | POA: Insufficient documentation

## 2013-04-20 DIAGNOSIS — M6281 Muscle weakness (generalized): Secondary | ICD-10-CM | POA: Insufficient documentation

## 2013-04-20 DIAGNOSIS — R262 Difficulty in walking, not elsewhere classified: Secondary | ICD-10-CM | POA: Insufficient documentation

## 2013-08-21 ENCOUNTER — Encounter: Payer: Self-pay | Admitting: Family Medicine

## 2013-08-21 ENCOUNTER — Telehealth: Payer: Self-pay

## 2013-08-21 NOTE — Telephone Encounter (Signed)
Pt mom called and states that medicaid turned down converage for her sons wheel chair b/c they need a letter of necessity from you stating that he has cerebral palsy and he requires a new wheel chair with hand and head control to be able to [facility] his wheelchair and the current wheelchair also has a broken wheel that needs to be replaced. Letter of Necessity should be faxed to [facility].Please Advise

## 2013-08-21 NOTE — Telephone Encounter (Signed)
Letter faxed to [facility] 

## 2013-08-21 NOTE — Telephone Encounter (Signed)
fix this

## 2013-08-24 ENCOUNTER — Telehealth: Payer: Self-pay | Admitting: Family Medicine

## 2013-08-27 NOTE — Telephone Encounter (Signed)
Find out who supplied that the bed and see if they can help. Get a Calyn's mother to help you to

## 2013-08-27 NOTE — Telephone Encounter (Signed)
What do i need to do for this? Where can i get bed rails

## 2013-08-28 NOTE — Telephone Encounter (Signed)
Pt mom crystal states that Joel Dudley does NOT need bed rails and she don't know who told us he needed them and that we have got the wrong pt and we do not need to be giving out info without asking mom first. She seemed mad and she said she was going to call shantel from [facility] about this

## 2013-10-02 ENCOUNTER — Telehealth: Payer: Self-pay | Admitting: Family Medicine

## 2013-10-02 NOTE — Telephone Encounter (Signed)
Left message on Joel Dudley VM TCB

## 2013-10-02 NOTE — Telephone Encounter (Signed)
Go ahead and set him up to get the Botox and work on getting the precertification.

## 2013-10-05 ENCOUNTER — Telehealth: Payer: Self-pay | Admitting: Family Medicine

## 2013-10-05 ENCOUNTER — Telehealth: Payer: Self-pay

## 2013-10-05 NOTE — Telephone Encounter (Signed)
Joel Dudley with registration from [facility] called and I explained we do not ususally handle the pre-cert for the Botox, normally whomever is ordering handles the pre-cert.  Joel Dudley then got Joel Dudley on line from the clinic.  She states they have already obtained the pre-certification for the botox for the patient and that they always do.

## 2013-10-05 NOTE — Telephone Encounter (Signed)
error 

## 2013-10-05 NOTE — Telephone Encounter (Signed)
Reviewed chart with Keba.  I have called Joann at [facility] [phone] to find out about the pre-cert.  Need more information. [place].

## 2013-10-30 ENCOUNTER — Encounter: Payer: Self-pay | Admitting: Family Medicine

## 2013-10-30 ENCOUNTER — Ambulatory Visit (INDEPENDENT_AMBULATORY_CARE_PROVIDER_SITE_OTHER): Payer: PRIVATE HEALTH INSURANCE | Admitting: Family Medicine

## 2013-10-30 VITALS — BP 120/78

## 2013-10-30 DIAGNOSIS — G801 Spastic diplegic cerebral palsy: Secondary | ICD-10-CM

## 2013-10-30 DIAGNOSIS — Z0000 Encounter for general adult medical examination without abnormal findings: Secondary | ICD-10-CM

## 2013-10-30 DIAGNOSIS — G809 Cerebral palsy, unspecified: Secondary | ICD-10-CM

## 2013-10-30 DIAGNOSIS — G808 Other cerebral palsy: Secondary | ICD-10-CM

## 2013-10-30 NOTE — Progress Notes (Signed)
   Subjective:    Patient ID: Joel Dudley, male    DOB: 11/20/1970, 43 y.o.   MRN: [id number]  HPI He is here for complete examination. He is cared for by his mother. They're now going over to [facility] and getting periodic Botox injections. This is helping a lot with his spasticity. He also expresses the fact that he likes getting the shots although they hurt they do help with his spasticity. He is on no other medications. Social and family history were reviewed. He is in a motorized wheelchair. He does have some bladder issues in the use diapers however some of his bladder issues are of convenience. Apparently his mother says he can verbalize when he needs to use the urinal but does not always do this.  Review of Systems     Objective:   Physical Exam alert and in no distress. Tympanic membranes and canals are normal. Throat is clear. Tonsils are normal. Neck is supple without adenopathy or thyromegaly. Cardiac exam shows a regular sinus rhythm without murmurs or gallops. Lungs are clear to auscultation. Abdominal exam shows no masses or tenderness. Full exam normal       Assessment & Plan:  CP (cerebral palsy), spastic  Congenital quadriplegia  He is doing quite well at the present time and no further intervention necessary.

## 2014-01-27 ENCOUNTER — Ambulatory Visit (INDEPENDENT_AMBULATORY_CARE_PROVIDER_SITE_OTHER): Payer: PRIVATE HEALTH INSURANCE | Admitting: Family Medicine

## 2014-01-27 ENCOUNTER — Encounter: Payer: Self-pay | Admitting: Family Medicine

## 2014-01-27 VITALS — BP 124/80 | HR 95

## 2014-01-27 DIAGNOSIS — F411 Generalized anxiety disorder: Secondary | ICD-10-CM | POA: Diagnosis not present

## 2014-01-27 DIAGNOSIS — Z23 Encounter for immunization: Secondary | ICD-10-CM

## 2014-01-27 MED ORDER — ALPRAZOLAM 0.25 MG PO TABS: 0.2500 mg | ORAL_TABLET | Freq: Two times a day (BID) | ORAL | Status: DC | PRN

## 2014-01-27 NOTE — Progress Notes (Signed)
   Subjective:    Patient ID: Joel Dudley, male    DOB: 09/13/1970, 43 y.o.   MRN: [id number]  HPI He is here with his mother. Apparently recently he has had a great deal of difficulty with anxiety. He is waking her up throughout the night, seems to be worried about his wheelchair in the fact that it might not be functional. He is also obsessing over other aspects of his wheelchair not being functional. This is way out of character for him. There have been no new problems with his wheelchair or with his overall health. He continues to get Botox shots which helped with his spasticity.   Review of Systems     Objective:   Physical Exam Alert and in no distress otherwise not examined       Assessment & Plan:  Anxiety state, unspecified - Plan: ALPRAZolam (XANAX) 0.25 MG tablet  Need for prophylactic vaccination and inoculation against influenza - Plan: Flu Vaccine QUAD 36+ mos PF IM (Fluarix Quad PF)  I reassured him that I would never allow the problems with his wheelchair to interfere and would always make sure that it was properly cared for. I will give some Xanax to see if this will help quiet him down. His mother is also concerned over this and really has no clues as to why this would occur at this time to

## 2014-02-09 ENCOUNTER — Telehealth: Payer: Self-pay | Admitting: Family Medicine

## 2014-02-09 NOTE — Telephone Encounter (Signed)
Joel Dudley called and stated she had received a parking placard before but DMV wouldn't except because it was too early. She needs another one signed. Please call [phone] when ready. Sending form back in folder.

## 2014-02-10 ENCOUNTER — Telehealth: Payer: Self-pay | Admitting: Family Medicine

## 2014-02-10 NOTE — Telephone Encounter (Signed)
Unable to reach mother, crystal.  Message was left for wayne to let crystal know that Reda's form is ready.

## 2014-11-01 ENCOUNTER — Encounter: Payer: Self-pay | Admitting: Family Medicine

## 2014-11-01 ENCOUNTER — Ambulatory Visit (INDEPENDENT_AMBULATORY_CARE_PROVIDER_SITE_OTHER): Payer: PRIVATE HEALTH INSURANCE | Admitting: Family Medicine

## 2014-11-01 VITALS — BP 118/78

## 2014-11-01 DIAGNOSIS — R279 Unspecified lack of coordination: Secondary | ICD-10-CM

## 2014-11-01 DIAGNOSIS — G801 Spastic diplegic cerebral palsy: Secondary | ICD-10-CM

## 2014-11-01 DIAGNOSIS — G809 Cerebral palsy, unspecified: Secondary | ICD-10-CM | POA: Diagnosis not present

## 2014-11-01 DIAGNOSIS — Z0000 Encounter for general adult medical examination without abnormal findings: Secondary | ICD-10-CM | POA: Diagnosis not present

## 2014-11-01 DIAGNOSIS — G808 Other cerebral palsy: Secondary | ICD-10-CM

## 2014-11-01 LAB — COMPREHENSIVE METABOLIC PANEL
ALK PHOS: 82 U/L (ref 39–117)
ALT: 9 U/L (ref 0–53)
AST: 14 U/L (ref 0–37)
Albumin: 3.9 g/dL (ref 3.5–5.2)
BUN: 10 mg/dL (ref 6–23)
CO2: 29 mEq/L (ref 19–32)
CREATININE: 0.95 mg/dL (ref 0.50–1.35)
Calcium: 9.5 mg/dL (ref 8.4–10.5)
Chloride: 102 mEq/L (ref 96–112)
Glucose, Bld: 83 mg/dL (ref 70–99)
Potassium: 4.2 mEq/L (ref 3.5–5.3)
Sodium: 138 mEq/L (ref 135–145)
Total Bilirubin: 0.5 mg/dL (ref 0.2–1.2)
Total Protein: 6.9 g/dL (ref 6.0–8.3)

## 2014-11-01 LAB — CBC WITH DIFFERENTIAL/PLATELET
Basophils Absolute: 0.1 10*3/uL (ref 0.0–0.1)
Basophils Relative: 1 % (ref 0–1)
Eosinophils Absolute: 0.1 10*3/uL (ref 0.0–0.7)
Eosinophils Relative: 2 % (ref 0–5)
HCT: 41.3 % (ref 39.0–52.0)
Hemoglobin: 13.7 g/dL (ref 13.0–17.0)
Lymphocytes Relative: 23 % (ref 12–46)
Lymphs Abs: 1.6 10*3/uL (ref 0.7–4.0)
MCH: 27.7 pg (ref 26.0–34.0)
MCHC: 33.2 g/dL (ref 30.0–36.0)
MCV: 83.4 fL (ref 78.0–100.0)
MONO ABS: 0.6 10*3/uL (ref 0.1–1.0)
MONOS PCT: 8 % (ref 3–12)
MPV: 10.3 fL (ref 8.6–12.4)
Neutro Abs: 4.6 10*3/uL (ref 1.7–7.7)
Neutrophils Relative %: 66 % (ref 43–77)
Platelets: 237 10*3/uL (ref 150–400)
RBC: 4.95 MIL/uL (ref 4.22–5.81)
RDW: 13.5 % (ref 11.5–15.5)
WBC: 6.9 10*3/uL (ref 4.0–10.5)

## 2014-11-01 LAB — LIPID PANEL
CHOL/HDL RATIO: 3.1 ratio
Cholesterol: 136 mg/dL (ref 0–200)
HDL: 44 mg/dL (ref >=40)
LDL CALC: 78 mg/dL (ref 0–99)
Triglycerides: 71 mg/dL (ref <150)
VLDL: 14 mg/dL (ref 0–40)

## 2014-11-01 NOTE — Progress Notes (Signed)
   Subjective:    Patient ID: Joel Dudley, male    DOB: 1971/02/18, 44 y.o.   MRN: [id number]  HPI He is here for complete examination. He does have underlying spastic CP. He is now getting Botox injections every 6 months with good results. He and his mother both note that his helping him with better control of his arms and legs. He does have occasional difficulty with urinary incontinence. His mother is doing a very good job of taking care of this. He is in a wheelchair and is able to mobilize fairly well with this. They have no other concerns or complaints. No chest pain, shortness of breath, GI issues.Family and social history as well as immunizations were reviewed.   Review of Systems  All other systems reviewed and are negative.      Objective:   Physical Exam BP 118/78 mmHg  General Appearance:    Alert, cooperative, no distress, appears stated age  Head:    Normocephalic, without obvious abnormality, atraumatic  Eyes:    PERRL, conjunctiva/corneas clear, EOM's intact,   Ears:    Normal TM's and external ear canals  Nose:   Nares normal, mucosa normal, no drainage or sinus   tenderness  Throat:   Lips, mucosa, and tongue normal; teeth and gums normal  Neck:   Supple, no lymphadenopathy;  thyroid:  no   enlargement/tenderness/nodules; no carotid   bruit or JVD  Back:    Spine nontender, no curvature, ROM normal, no CVA     tenderness  Lungs:     Clear to auscultation bilaterally without wheezes, rales or     ronchi; respirations unlabored  Chest Wall:    No tenderness or deformity   Heart:    Regular rate and rhythm, S1 and S2 normal, no murmur, rub   or gallop  Breast Exam:    No chest wall tenderness, masses or gynecomastia  Abdomen:     Soft, non-tender, nondistended, normoactive bowel sounds,    no masses, no hepatosplenomegaly  Genitalia:    Normal male external genitalia without lesions.    Rectal:   Deferred  Extremities:   No clubbing, cyanosis or edema  Pulses:    2+ and symmetric all extremities  Skin:   Skin color, texture, turgor normal, no rashes or lesions  Lymph nodes:   Cervical, supraclavicular, and axillary nodes normal  Neurologic:   CNII-XII intact, normal strength, sensation and gait; reflexes 2+ and symmetric throughout          Psych:   Normal mood, affect, hygiene and grooming.          Assessment & Plan:  Routine general medical examination at a health care facility - Plan: CBC with Differential/Platelet, Comprehensive metabolic panel, Lipid panel  CP (cerebral palsy), spastic  Infantile cerebral palsy  Congenital quadriplegia  Lack of coordination His mother is taking excellent care of him. Will continue to get his Botox injections every 6 months.

## 2015-05-26 ENCOUNTER — Ambulatory Visit (INDEPENDENT_AMBULATORY_CARE_PROVIDER_SITE_OTHER): Payer: PRIVATE HEALTH INSURANCE | Admitting: Family Medicine

## 2015-05-26 ENCOUNTER — Encounter: Payer: Self-pay | Admitting: Family Medicine

## 2015-05-26 VITALS — BP 128/84 | HR 80 | Temp 98.4°F

## 2015-05-26 DIAGNOSIS — S39011A Strain of muscle, fascia and tendon of abdomen, initial encounter: Secondary | ICD-10-CM | POA: Diagnosis not present

## 2015-05-26 DIAGNOSIS — S76219A Strain of adductor muscle, fascia and tendon of unspecified thigh, initial encounter: Secondary | ICD-10-CM

## 2015-05-26 LAB — POCT URINALYSIS DIPSTICK
BILIRUBIN UA: NEGATIVE
Glucose, UA: NEGATIVE
Ketones, UA: NEGATIVE
Leukocytes, UA: NEGATIVE
Nitrite, UA: NEGATIVE
PH UA: 6
PROTEIN UA: NEGATIVE
RBC UA: NEGATIVE
Spec Grav, UA: 1.015
UROBILINOGEN UA: 4

## 2015-05-26 NOTE — Progress Notes (Signed)
   Subjective:    Patient ID: Joel Dudley, male    DOB: Aug 10, 1970, 45 y.o.   MRN: [id number]  HPI 2 days ago he had difficulty with constipation and did had a very large stool that apparently caused some bleeding and some rectal discomfort. He also then complained of some inguinal discomfort. His mother is concerned about hernia.   Review of Systems     Objective:   Physical Exam Abdominal and inguinal exam were negative for masses. He was slightly tender in inguinal area. Penis and testes normal. Rectal exam showed no fissure however he was tender at the 6:00 position.       Assessment & Plan:  Inguinal strain, initial encounter - Plan: POCT urinalysis dipstick I explained that his symptoms were most likely residual from the constipation and very hard large stool. Recommend Tylenol for aches and pains and call if further trouble.

## 2015-08-23 ENCOUNTER — Telehealth: Payer: Self-pay | Admitting: Family Medicine

## 2015-08-23 NOTE — Telephone Encounter (Signed)
I have faxed referral to Dr.Todd Jimmye Norman

## 2015-08-23 NOTE — Telephone Encounter (Signed)
Go ahead and set this up

## 2015-08-23 NOTE — Telephone Encounter (Signed)
Pt's mom, [name], called stating that pt has 3 moles that have developed on his back that are itchy and bothering him. Mom asking for a referral to dermatologist Dr Loyal Jacobson in [place] ( fax# [phone])

## 2015-08-31 ENCOUNTER — Ambulatory Visit (INDEPENDENT_AMBULATORY_CARE_PROVIDER_SITE_OTHER): Payer: PRIVATE HEALTH INSURANCE | Admitting: Family Medicine

## 2015-08-31 VITALS — BP 130/70 | HR 78

## 2015-08-31 DIAGNOSIS — S80812A Abrasion, left lower leg, initial encounter: Secondary | ICD-10-CM

## 2015-08-31 NOTE — Progress Notes (Signed)
   Subjective:    Patient ID: Joel Dudley, male    DOB: 1971/03/03, 45 y.o.   MRN: [id number]  HPI He is brought in by his mother for evaluation of lesion on the left calf. Mother is also concerned about his hands and arms drawing. He does get regular Botox injections and is due for his next injection in June.   Review of Systems     Objective:   Physical Exam Alert and in no distress. Full motion of both hands and elbows. When I would move him slowly he could easily open up his fingers and wrists as well as elbows. While remaining calm he would have no difficulty but when he became agitated he did have more spastic type activity. Healing abrasion noted on the left anterior shin area. It is not red hot or tender.       Assessment & Plan:  Abrasion of calf, left, initial encounter The abrasion is healing nicely. I did recommend possibly getting in for Botox injections more often than every 6 months. She will discuss this when she goes back to [employer].

## 2015-10-04 ENCOUNTER — Telehealth: Payer: Self-pay

## 2015-10-04 NOTE — Telephone Encounter (Signed)
Fasting is not absolutely necessary

## 2015-10-04 NOTE — Telephone Encounter (Signed)
Spoke with pt's mother to schedule CPE- mom questions if pt needs to be fasting, advised her of protocol for the office of 6 hours fasting. Mom states you usually do not check labs on pt. Advised mom I would check with you and call her back if anything changes. I see where lipid and etc was checked last yr.  Mom states that afternoon appt work best for her. I can call her to advise of the appt. Please advise. Victorino December

## 2015-10-05 NOTE — Telephone Encounter (Signed)
Do mind to call pt mom

## 2015-10-05 NOTE — Telephone Encounter (Signed)
LMTCB. /RLB 

## 2015-10-11 NOTE — Telephone Encounter (Signed)
[name] made aware. Victorino December

## 2015-10-11 NOTE — Telephone Encounter (Signed)
[facility] 

## 2015-10-31 ENCOUNTER — Telehealth: Payer: Self-pay | Admitting: Family Medicine

## 2015-10-31 NOTE — Telephone Encounter (Signed)
I talked to Beverlee Nims about this we have never done this she looked back 2 years ago and [facility] does this

## 2015-10-31 NOTE — Telephone Encounter (Signed)
Cheri, I believe this should go to you.

## 2015-10-31 NOTE — Telephone Encounter (Signed)
Joel Dudley I have not ever had to do this for him

## 2015-10-31 NOTE — Telephone Encounter (Signed)
Joel Dudley [facility] Pt needs PA for his botox  [phone] and follow prompts

## 2015-10-31 NOTE — Telephone Encounter (Signed)
Pt's mom, [name], called to give [place] a heads up that [place], from [facility] will be calling in just a few minutes to get a referral/ auth for pt to get Botox today at his 1:00 appt in their office. Mom states this is something that pt gets regularly so need referral/auth handled asap since appt is today

## 2015-11-14 ENCOUNTER — Encounter: Payer: PRIVATE HEALTH INSURANCE | Admitting: Family Medicine

## 2015-11-21 ENCOUNTER — Encounter: Payer: PRIVATE HEALTH INSURANCE | Admitting: Family Medicine

## 2015-12-06 ENCOUNTER — Encounter: Payer: PRIVATE HEALTH INSURANCE | Admitting: Family Medicine

## 2015-12-28 ENCOUNTER — Ambulatory Visit (INDEPENDENT_AMBULATORY_CARE_PROVIDER_SITE_OTHER): Payer: PRIVATE HEALTH INSURANCE | Admitting: Family Medicine

## 2015-12-28 ENCOUNTER — Encounter: Payer: Self-pay | Admitting: Family Medicine

## 2015-12-28 VITALS — BP 120/80 | HR 75

## 2015-12-28 DIAGNOSIS — G808 Other cerebral palsy: Secondary | ICD-10-CM

## 2015-12-28 DIAGNOSIS — Z23 Encounter for immunization: Secondary | ICD-10-CM | POA: Diagnosis not present

## 2015-12-28 DIAGNOSIS — G801 Spastic diplegic cerebral palsy: Secondary | ICD-10-CM | POA: Diagnosis not present

## 2015-12-28 DIAGNOSIS — R279 Unspecified lack of coordination: Secondary | ICD-10-CM

## 2015-12-28 DIAGNOSIS — Z0000 Encounter for general adult medical examination without abnormal findings: Secondary | ICD-10-CM | POA: Diagnosis not present

## 2015-12-28 NOTE — Progress Notes (Signed)
   Subjective:    Patient ID: Joel Dudley, male    DOB: 1971-04-05, 45 y.o.   MRN: [id number]  HPI He is here for complete examination. He does have cerebral palsy and congenital quadriplegia. He goes to [facility] and does get periodic Botox injections. This has made a tremendous difference in his spasticity allowing him much more movement. Both he and his mother are quite happy with that. His mother is the primary caregiver. Presently he is on no medications. She states that he is becoming more mobile and his speech has actually improved. Family and social history as well as health maintenance and immunizations were reviewed.   Review of Systems     Objective:   Physical Exam Alert and in no distress. Tympanic membranes and canals are normal. Pharyngeal area is normal. Neck is supple without adenopathy or thyromegaly. Cardiac exam shows a regular sinus rhythm without murmurs or gallops. Lungs are clear to auscultation. Abdominal exam shows no masses or tenderness. He is wheelchair-bound. So spasticity is noted but much less than in the past.        Assessment & Plan:  CP (cerebral palsy), spastic ([place]) - Plan: Ambulatory referral to Physical Therapy  Congenital quadriplegia ([place]) - Plan: Ambulatory referral to Physical Therapy  Routine general medical examination at a health care facility - Plan: Ambulatory referral to Physical Therapy  Need for prophylactic vaccination and inoculation against influenza - Plan: Ambulatory referral to Physical Therapy  Lack of coordination - Plan: Ambulatory referral to Physical Therapy Overall he seems to be doing much better since he is now getting routine Botox injections. I will also set him up to physical therapy. His mother does mention the fact that as she gets older she is having difficulty dealing with lifting him. The more physically able he gets, the better it will be for both.

## 2015-12-29 ENCOUNTER — Telehealth: Payer: Self-pay

## 2015-12-29 NOTE — Telephone Encounter (Signed)
I have called Joel Dudley left message to what P/T [name] needs and if she wanted to go to [facility] to please call me back and to let me know

## 2015-12-30 ENCOUNTER — Telehealth: Payer: Self-pay | Admitting: Family Medicine

## 2015-12-30 NOTE — Telephone Encounter (Signed)
[name] called and said to let Joel Dudley know that she want to take pt to [facility] for therapy

## 2016-01-03 ENCOUNTER — Telehealth: Payer: Self-pay | Admitting: Family Medicine

## 2016-01-03 NOTE — Telephone Encounter (Signed)
I have faxed the referral to [facility]

## 2016-01-03 NOTE — Telephone Encounter (Signed)
Rcvd message from answering service that [place] is needing for our nurse to call her regarding pt's appts. Called [name] back to get details of what she needed. No answer. Left message to call back.

## 2016-03-27 ENCOUNTER — Encounter: Payer: Self-pay | Admitting: Family Medicine

## 2016-03-27 ENCOUNTER — Ambulatory Visit (INDEPENDENT_AMBULATORY_CARE_PROVIDER_SITE_OTHER): Payer: PRIVATE HEALTH INSURANCE | Admitting: Family Medicine

## 2016-03-27 VITALS — BP 136/100 | HR 86 | Temp 97.1°F

## 2016-03-27 DIAGNOSIS — N4889 Other specified disorders of penis: Secondary | ICD-10-CM | POA: Diagnosis not present

## 2016-03-27 NOTE — Progress Notes (Signed)
   Subjective:    Patient ID: Joel Dudley, male    DOB: 1970-10-25, 44 y.o.   MRN: [id number]  HPI He is brought in by his mother for evaluation of redness and discomfort of the penis. He does have urinary incontinence and in the past has used a condom catheter but none recently due to difficulty having him stay on and him not wanting it. She does good job of keeping the area clean and dry. She has been using Vicks VapoRub on the penis.   Review of Systems     Objective:   Physical Exam Exam of the penis does show slight erythema to the head of the penis and on the distal shaft but no warmth or tenderness. The rest the penis appears normal. Inguinal areas also normal.       Assessment & Plan:  Penile irritation This is probably secondary to exposure to urine. Recommend she continue to keep the area clean which she is doing a good job of. Also recommend an OTC Neosporin type ointment on this. If continued difficulty, we might need to use a condom catheter.

## 2016-04-12 ENCOUNTER — Telehealth: Payer: Self-pay | Admitting: Family Medicine

## 2016-04-12 NOTE — Telephone Encounter (Signed)
Called rehab to know what the RX needed to say WHEELCHAIR DX: G80.1,G80.8,G80.9 R27.9 and faxed order with ATTN: Financial dept.

## 2016-04-12 NOTE — Telephone Encounter (Signed)
Take care of this 

## 2016-04-12 NOTE — Telephone Encounter (Signed)
Pt's mother called and stated that last time they were in she told [facility] that pt needed a new wheelchair. She states that she needs orders sent over. Please fax orders  to [place]: financial dept. [phone]

## 2016-06-22 ENCOUNTER — Telehealth: Payer: Self-pay | Admitting: Family Medicine

## 2016-06-22 NOTE — Telephone Encounter (Signed)
Emailed Rx to mom for wheelchair [employer] social research officer

## 2016-06-22 NOTE — Telephone Encounter (Signed)
Take care of this 

## 2016-06-22 NOTE — Telephone Encounter (Signed)
Pt mother called and states that she needs a RX for wheelchair repair for Donnald wheelchair and would like it emailed it to her her email it [email or web address] she can be reached at [phone] with any questions, they use [employer] for his wheel chair

## 2016-07-05 ENCOUNTER — Ambulatory Visit (INDEPENDENT_AMBULATORY_CARE_PROVIDER_SITE_OTHER): Payer: Medicaid Other | Admitting: Family Medicine

## 2016-07-05 VITALS — BP 120/82 | HR 60 | Ht 61.0 in | Wt 135.0 lb

## 2016-07-05 DIAGNOSIS — G801 Spastic diplegic cerebral palsy: Secondary | ICD-10-CM | POA: Diagnosis not present

## 2016-07-05 DIAGNOSIS — G809 Cerebral palsy, unspecified: Secondary | ICD-10-CM

## 2016-07-05 DIAGNOSIS — G808 Other cerebral palsy: Secondary | ICD-10-CM

## 2016-07-05 DIAGNOSIS — R279 Unspecified lack of coordination: Secondary | ICD-10-CM | POA: Diagnosis not present

## 2016-07-05 NOTE — Progress Notes (Signed)
   Subjective:    Patient ID: Joel Dudley, male    DOB: [birth year], 46 y.o.   MRN: [id number]  HPI He is here for a PMD evaluation. He does have underlying CP which interferes with his mobility. He has good strength in his arms and legs but an inability to use that strength effectively. Presently he is not in a manual wheelchair but does use use a mechanical wheelchair however it is not functioning properly at the present time.   Review of Systems     Objective:   Physical Exam  Alert and in no distress. Cardiac exam shows regular rhythm without murmurs or gallops lungs are clear to auscultation. He has spasticity interfering with functional ability to use his arms and legs. He uses a headache control on his wheelchair.       Assessment & Plan:  Lack of coordination  Congenital quadriplegia (HCC)  Infantile cerebral palsy (HCC)  CP (cerebral palsy), spastic ([place])  A PMD would help with ADLs especially with mobility getting to the kitchen and using refrigerator, getting into the bathroom and being able to use bathroom facilities more appropriately

## 2016-10-15 ENCOUNTER — Encounter: Payer: Self-pay | Admitting: Family Medicine

## 2016-10-15 ENCOUNTER — Ambulatory Visit (INDEPENDENT_AMBULATORY_CARE_PROVIDER_SITE_OTHER): Payer: Medicaid Other | Admitting: Family Medicine

## 2016-10-15 VITALS — BP 150/80 | HR 97 | Temp 97.6°F | Resp 20 | Wt 135.0 lb

## 2016-10-15 DIAGNOSIS — H60502 Unspecified acute noninfective otitis externa, left ear: Secondary | ICD-10-CM | POA: Diagnosis not present

## 2016-10-15 MED ORDER — NEOMYCIN-POLYMYXIN-HC 3.5-10000-1 OT SUSP: 3.0000 [drp] | Freq: Three times a day (TID) | OTIC | 0 refills | Status: DC

## 2016-10-15 NOTE — Progress Notes (Signed)
   Subjective:    Patient ID: Joel Dudley, male    DOB: [birth year], 46 y.o.   MRN: [id number]  HPI He is here with his mother complaining of foul drainage coming from the left ear. He does also have a slight cough.   Review of Systems     Objective:   Physical Exam Both canals are quite narrow. The left canal did have a small amount of drainage present. The TMs were poorly seen. Neck is supple without adenopathy.       Assessment & Plan:  Acute otitis externa of left ear, unspecified type - Plan: neomycin-polymyxin-hydrocortisone (CORTISPORIN) 3.5-10000-1 OTIC suspension  I will treat this topically and if the coughing gets worse, they will need to return here for further evaluation.

## 2016-10-18 ENCOUNTER — Telehealth: Payer: Self-pay

## 2016-10-18 NOTE — Telephone Encounter (Signed)
Records mailed to [facility] [address] Kristeen Mans [place] [phone] per signed release. Victorino December

## 2016-12-31 ENCOUNTER — Encounter: Payer: Self-pay | Admitting: Family Medicine

## 2016-12-31 ENCOUNTER — Ambulatory Visit (INDEPENDENT_AMBULATORY_CARE_PROVIDER_SITE_OTHER): Payer: Medicaid Other | Admitting: Family Medicine

## 2016-12-31 VITALS — BP 116/70

## 2016-12-31 DIAGNOSIS — G808 Other cerebral palsy: Secondary | ICD-10-CM

## 2016-12-31 DIAGNOSIS — Z23 Encounter for immunization: Secondary | ICD-10-CM

## 2016-12-31 DIAGNOSIS — R32 Unspecified urinary incontinence: Secondary | ICD-10-CM

## 2016-12-31 LAB — CBC WITH DIFFERENTIAL/PLATELET
BASOS ABS: 51 {cells}/uL (ref 0–200)
Basophils Relative: 1 %
EOS ABS: 153 {cells}/uL (ref 15–500)
Eosinophils Relative: 3 %
HEMATOCRIT: 36.3 % — AB (ref 38.5–50.0)
Hemoglobin: 11.9 g/dL — ABNORMAL LOW (ref 13.2–17.1)
Lymphocytes Relative: 29 %
Lymphs Abs: 1479 cells/uL (ref 850–3900)
MCH: 27.2 pg (ref 27.0–33.0)
MCHC: 32.8 g/dL (ref 32.0–36.0)
MCV: 83.1 fL (ref 80.0–100.0)
MPV: 10.3 fL (ref 7.5–12.5)
Monocytes Absolute: 408 cells/uL (ref 200–950)
Monocytes Relative: 8 %
NEUTROS ABS: 3009 {cells}/uL (ref 1500–7800)
NEUTROS PCT: 59 %
Platelets: 261 10*3/uL (ref 140–400)
RBC: 4.37 MIL/uL (ref 4.20–5.80)
RDW: 13 % (ref 11.0–15.0)
WBC: 5.1 10*3/uL (ref 4.0–10.5)

## 2016-12-31 NOTE — Progress Notes (Signed)
   Subjective:    Patient ID: Joel Dudley, male    DOB: [birth year], 46 y.o.   MRN: [id number]  HPI He is here for complete examination. 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 He is on no medications but every 6 months he does get Botox injections which has helped with his spasticity greatly. His speech patterns are unchanged in his mother does recognize most of them and I also recognize partially some of his speech. He is on no other medications. His mother is his primary caregiver. She states that he is having no difficulties. He does use diapers to help with urinary incontinence. The mother seems be handling this well. No other concerns or complaints. Family and social history as well as immunizations and health maintenance was reviewed.   Review of Systems  All other systems reviewed and are negative.      Objective:   Physical Exam BP 116/70  BP 116/70   General Appearance:    Alert, cooperative, no distress, appears stated age  Head:    Normocephalic, without obvious abnormality, atraumatic  Eyes:    PERRL, conjunctiva/corneas clear, EOM's intact, fundi    benign  Ears:    Normal TM's and external ear canals  Nose:   Nares normal, mucosa normal, no drainage or sinus   tenderness  Throat:   Lips, mucosa, and tongue normal; teeth and gums normal  Neck:   Supple, no lymphadenopathy;  thyroid:  no   enlargement/tenderness/nodules; no carotid   bruit or JVD     Lungs:     Clear to auscultation bilaterally without wheezes, rales or     ronchi; respirations unlabored      Heart:    Regular rate and rhythm, S1 and S2 normal, no murmur, rub   or gallop     Abdomen:     Soft, non-tender, nondistended, normoactive bowel sounds,    no masses, no hepatosplenomegaly  Genitalia:  Deferred   Rectal:   deferred  Extremities:   No clubbing, cyanosis or edema  Pulses:   2+ and symmetric all extremities  Skin:   Skin color, texture, turgor normal, no rashes or  lesions  Lymph nodes:   Cervical, supraclavicular, and axillary nodes normal  Neurologic:   CNII-XII intact, normal strength, sensation and gait; reflexes 2+ and symmetric throughout          Psych:   Normal mood, affect, hygiene and grooming.                                                                Assessment & Plan:  Congenital quadriplegia ([place]) - Plan: CBC with Differential/Platelet, Comprehensive metabolic panel, Lipid panel  Mixed spastic athetoid cerebral palsy ([place]) - Plan: CBC with Differential/Platelet, Comprehensive metabolic panel, Lipid panel  Need for influenza vaccination - Plan: Flu Vaccine QUAD 6+ mos PF IM (Fluarix Quad PF)

## 2017-01-01 ENCOUNTER — Other Ambulatory Visit: Payer: Self-pay

## 2017-01-01 DIAGNOSIS — R7989 Other specified abnormal findings of blood chemistry: Secondary | ICD-10-CM

## 2017-01-01 LAB — COMPREHENSIVE METABOLIC PANEL
ALT: 9 U/L (ref 9–46)
AST: 16 U/L (ref 10–40)
Albumin: 4 g/dL (ref 3.6–5.1)
Alkaline Phosphatase: 71 U/L (ref 40–115)
BUN: 14 mg/dL (ref 7–25)
CO2: 23 mmol/L (ref 20–32)
CREATININE: 0.89 mg/dL (ref 0.60–1.35)
Calcium: 8.9 mg/dL (ref 8.6–10.3)
Chloride: 105 mmol/L (ref 98–110)
Glucose, Bld: 87 mg/dL (ref 65–99)
Potassium: 4.1 mmol/L (ref 3.5–5.3)
Sodium: 138 mmol/L (ref 135–146)
TOTAL PROTEIN: 6.5 g/dL (ref 6.1–8.1)
Total Bilirubin: 0.4 mg/dL (ref 0.2–1.2)

## 2017-01-01 LAB — LIPID PANEL
Cholesterol: 133 mg/dL (ref <200)
HDL: 42 mg/dL (ref >40)
LDL Cholesterol: 81 mg/dL (ref <100)
TRIGLYCERIDES: 48 mg/dL (ref <150)
Total CHOL/HDL Ratio: 3.2 Ratio (ref <5.0)
VLDL: 10 mg/dL (ref <30)

## 2017-02-28 ENCOUNTER — Other Ambulatory Visit: Payer: Medicaid Other

## 2017-02-28 DIAGNOSIS — R7989 Other specified abnormal findings of blood chemistry: Secondary | ICD-10-CM

## 2017-02-28 LAB — CBC WITH DIFFERENTIAL/PLATELET
Basophils Absolute: 84 cells/uL (ref 0–200)
Basophils Relative: 1.2 %
Eosinophils Absolute: 133 cells/uL (ref 15–500)
Eosinophils Relative: 1.9 %
HCT: 29.4 % — ABNORMAL LOW (ref 38.5–50.0)
Hemoglobin: 9.6 g/dL — ABNORMAL LOW (ref 13.2–17.1)
Lymphs Abs: 1673 cells/uL (ref 850–3900)
MCH: 25.3 pg — AB (ref 27.0–33.0)
MCHC: 32.7 g/dL (ref 32.0–36.0)
MCV: 77.4 fL — AB (ref 80.0–100.0)
MPV: 10.7 fL (ref 7.5–12.5)
Monocytes Relative: 10.5 %
NEUTROS PCT: 62.5 %
Neutro Abs: 4375 cells/uL (ref 1500–7800)
PLATELETS: 327 10*3/uL (ref 140–400)
RBC: 3.8 10*6/uL — AB (ref 4.20–5.80)
RDW: 12.5 % (ref 11.0–15.0)
TOTAL LYMPHOCYTE: 23.9 %
WBC: 7 10*3/uL (ref 3.8–10.8)
WBCMIX: 735 {cells}/uL (ref 200–950)

## 2017-03-01 ENCOUNTER — Other Ambulatory Visit: Payer: Self-pay | Admitting: Family Medicine

## 2017-03-01 DIAGNOSIS — D649 Anemia, unspecified: Secondary | ICD-10-CM

## 2017-03-12 ENCOUNTER — Other Ambulatory Visit: Payer: Self-pay | Admitting: Gastroenterology

## 2017-03-15 ENCOUNTER — Other Ambulatory Visit: Payer: Self-pay | Admitting: Gastroenterology

## 2017-03-21 ENCOUNTER — Other Ambulatory Visit: Payer: Self-pay

## 2017-03-21 ENCOUNTER — Encounter (HOSPITAL_COMMUNITY): Payer: Self-pay | Admitting: *Deleted

## 2017-03-21 NOTE — Progress Notes (Signed)
Pre-op call completed by pt mother, [name] Koslow. Mother denies that pt has any acute cardiopulmonary issues. Mother denies that pt is currently under the care of a cardiologist but stated that pt was seen by Dr. Agustin Cree, Cardiology > 5 years ago; records requested. Mother denies that pt had a stress test, echo and cardiac cath. Mother denies a chest x ray and EKG within the last year. Last labs were 02/28/17 by PCP in Epic. Mother made aware to have pt stop taking Aspirin,vitamins, fish oil and herbal medications. Do not take any NSAIDs ie: Ibuprofen, Advil, Naproxen (Aleve), Motrin, BC and Goody Powder or any medication containing Aspirin. Mother verbalized understanding of all pre-op instructions. Willeen Cass, NP, made aware of pt history; NP advised that records cardiac be requested.

## 2017-03-22 ENCOUNTER — Encounter (HOSPITAL_COMMUNITY): Payer: Self-pay | Admitting: *Deleted

## 2017-03-22 ENCOUNTER — Encounter (HOSPITAL_COMMUNITY)
Admission: RE | Disposition: A | Discharge status: Home / Self Care | Payer: Self-pay | Source: Ambulatory Visit | Attending: Gastroenterology

## 2017-03-22 ENCOUNTER — Ambulatory Visit (HOSPITAL_COMMUNITY)
Admission: RE | Admit: 2017-03-22 | Discharge: 2017-03-22 | Disposition: A | Discharge status: Home / Self Care | Payer: Medicaid Other | Source: Ambulatory Visit | Attending: Gastroenterology | Admitting: Gastroenterology

## 2017-03-22 ENCOUNTER — Ambulatory Visit (HOSPITAL_COMMUNITY): Payer: Medicaid Other | Admitting: Emergency Medicine

## 2017-03-22 DIAGNOSIS — K2270 Barrett's esophagus without dysplasia: Secondary | ICD-10-CM | POA: Insufficient documentation

## 2017-03-22 DIAGNOSIS — D509 Iron deficiency anemia, unspecified: Secondary | ICD-10-CM | POA: Diagnosis present

## 2017-03-22 DIAGNOSIS — Z791 Long term (current) use of non-steroidal anti-inflammatories (NSAID): Secondary | ICD-10-CM | POA: Insufficient documentation

## 2017-03-22 DIAGNOSIS — K449 Diaphragmatic hernia without obstruction or gangrene: Secondary | ICD-10-CM | POA: Insufficient documentation

## 2017-03-22 DIAGNOSIS — Z7982 Long term (current) use of aspirin: Secondary | ICD-10-CM | POA: Insufficient documentation

## 2017-03-22 HISTORY — DX: Headache: R51

## 2017-03-22 HISTORY — DX: Major depressive disorder, single episode, unspecified: F32.9

## 2017-03-22 HISTORY — PX: ESOPHAGOGASTRODUODENOSCOPY: SHX5428

## 2017-03-22 HISTORY — DX: Depression, unspecified: F32.A

## 2017-03-22 HISTORY — DX: Unspecified convulsions: R56.9

## 2017-03-22 HISTORY — DX: Headache, unspecified: R51.9

## 2017-03-22 LAB — CBC
HCT: 30.1 % — ABNORMAL LOW (ref 39.0–52.0)
Hemoglobin: 9.8 g/dL — ABNORMAL LOW (ref 13.0–17.0)
MCH: 25.3 pg — AB (ref 26.0–34.0)
MCHC: 32.6 g/dL (ref 30.0–36.0)
MCV: 77.6 fL — AB (ref 78.0–100.0)
PLATELETS: 251 10*3/uL (ref 150–400)
RBC: 3.88 MIL/uL — ABNORMAL LOW (ref 4.22–5.81)
RDW: 14.7 % (ref 11.5–15.5)
WBC: 4.7 10*3/uL (ref 4.0–10.5)

## 2017-03-22 SURGERY — EGD (ESOPHAGOGASTRODUODENOSCOPY)
Anesthesia: General

## 2017-03-22 MED ORDER — SUCCINYLCHOLINE CHLORIDE 20 MG/ML IJ SOLN
INTRAMUSCULAR | Status: DC | PRN
  Administered 2017-03-22: 60 mg via INTRAVENOUS

## 2017-03-22 MED ORDER — LIDOCAINE 2% (20 MG/ML) 5 ML SYRINGE
INTRAMUSCULAR | Status: DC | PRN
Start: 2017-03-22 — End: 2017-03-22
  Administered 2017-03-22: 60 mg via INTRAVENOUS

## 2017-03-22 MED ORDER — ROCURONIUM BROMIDE 10 MG/ML (PF) SYRINGE: PREFILLED_SYRINGE | INTRAVENOUS | Status: DC | PRN

## 2017-03-22 MED ORDER — LACTATED RINGERS IV SOLN
INTRAVENOUS | Status: DC | PRN
  Administered 2017-03-22: 12:00:00 via INTRAVENOUS

## 2017-03-22 MED ORDER — ONDANSETRON HCL 4 MG/2ML IJ SOLN
INTRAMUSCULAR | Status: DC | PRN
  Administered 2017-03-22: 4 mg via INTRAVENOUS

## 2017-03-22 MED ORDER — SODIUM CHLORIDE 0.9 % IV SOLN: INTRAVENOUS | Status: DC

## 2017-03-22 MED ORDER — PROPOFOL 10 MG/ML IV BOLUS
INTRAVENOUS | Status: DC | PRN
  Administered 2017-03-22: 20 mg via INTRAVENOUS
  Administered 2017-03-22: 40 mg via INTRAVENOUS
  Administered 2017-03-22: 140 mg via INTRAVENOUS
  Administered 2017-03-22: 50 mg via INTRAVENOUS

## 2017-03-22 MED ORDER — DEXAMETHASONE SODIUM PHOSPHATE 10 MG/ML IJ SOLN
INTRAMUSCULAR | Status: DC | PRN
  Administered 2017-03-22: 5 mg via INTRAVENOUS

## 2017-03-22 NOTE — Op Note (Signed)
[facility] Patient Name: Joel Dudley Procedure Date : 03/22/2017 MRN: [id number] Attending MD: Clarene Essex , MD Date of Birth: 1970-07-23 CSN: [id number] Age: 46 Admit Type: Outpatient Procedure:                Upper GI endoscopy Indications:              Iron deficiency anemia Providers:                Clarene Essex, MD, Zenon Mayo, RN, Cletis Athens,                            Technician Referring MD:              Medicines:                General Anesthesia Complications:            No immediate complications. Estimated Blood Loss:     Estimated blood loss: none. Procedure:                Pre-Anesthesia Assessment:                           - Prior to the procedure, a History and Physical                            was performed, and patient medications and                            allergies were reviewed. The patient's tolerance of                            previous anesthesia was also reviewed. The risks                            and benefits of the procedure and the sedation                            options and risks were discussed with the patient.                            All questions were answered, and informed consent                            was obtained. Prior Anticoagulants: The patient has                            taken no previous anticoagulant or antiplatelet                            agents. ASA Grade Assessment: II - A patient with                            mild systemic disease. After reviewing the risks  and benefits, the patient was deemed in                            satisfactory condition to undergo the procedure.                           After obtaining informed consent, the endoscope was                            passed under direct vision. Throughout the                            procedure, the patient's blood pressure, pulse, and                            oxygen saturations were monitored  continuously. The                            EG-[id number] ([id number]) scope was introduced through the                            mouth, and advanced to the third part of duodenum.                            The upper GI endoscopy was accomplished without                            difficulty. The patient tolerated the procedure                            well. Scope In: Scope Out: Findings:      A small hiatal hernia was present.      There were esophageal mucosal changes suspicious for short-segment       Barrett's esophagus present in the lower third of the esophagus.There is       also a moderate amount of inflammation including linear erosions and a       possible tiny GE junction nodule Biopsies were taken with a cold forceps       for histologyof all of the above.      The entire examined stomach was normal.      The duodenal bulb, first portion of the duodenum, second portion of the       duodenum and third portion of the duodenum were normal.      The exam was otherwise without abnormality. Impression:               - Small hiatal hernia.                           - Esophageal mucosal changes suspicious for                            short-segment Barrett's esophagus as well as                            significant distal inflammation  and possible tiny                            nodule as above. Biopsied.                           - Normal stomach.                           - Normal duodenal bulb, first portion of the                            duodenum, second portion of the duodenum and third                            portion of the duodenum.                           - The examination was otherwise normal. Moderate Sedation:      moderate sedation-none Recommendation:           - Patient has a contact number available for                            emergencies. The signs and symptoms of potential                            delayed complications were discussed with the                             patient. Return to normal activities tomorrow.                            Written discharge instructions were provided to the                            patient.                           - Soft diet today.                           - Continue present medications.and continue iron                            prescription once a day and begin once a day                            generic pump inhibitors                           - Await pathology results.                           - Return to GI clinic after studies are  complete.and if biopsies okay will follow up in 4-6                            weeks                           - Telephone GI clinic for pathology results in 1                            week.                           - Telephone GI clinic if symptomatic PRN.                           - Check hemogram with white blood cell count and                            platelets today.                           - Collect Hemoccults on three spontaneously passed                            stools at appointment to be scheduled.which we will                            do off iron in the future Procedure Code(s):        --- Professional ---                           [id number], Esophagogastroduodenoscopy, flexible,                            transoral; with biopsy, single or multiple Diagnosis Code(s):        --- Professional ---                           K44.9, Diaphragmatic hernia without obstruction or                            gangrene                           K22.8, Other specified diseases of esophagus                           D50.9, Iron deficiency anemia, unspecified CPT copyright 2016 American Medical Association. All rights reserved. The codes documented in this report are preliminary and upon coder review may  be revised to meet current compliance requirements. Clarene Essex, MD 03/22/2017 12:21:38 PM This report has been signed  electronically. Number of Addenda: 0

## 2017-03-22 NOTE — Anesthesia Procedure Notes (Signed)
Procedure Name: Intubation Date/Time: 03/22/2017 11:55 AM Performed by: Valda Favia, CRNA Pre-anesthesia Checklist: Patient identified, Emergency Drugs available, Suction available, Patient being monitored and Timeout performed Patient Re-evaluated:Patient Re-evaluated prior to induction Oxygen Delivery Method: Circle system utilized Preoxygenation: Pre-oxygenation with 100% oxygen Induction Type: IV induction and Rapid sequence Laryngoscope Size: Mac and 4 Grade View: Grade I Tube type: Oral Tube size: 7.5 mm Number of attempts: 1 Airway Equipment and Method: Stylet Secured at: 22 cm Tube secured with: Tape Dental Injury: Teeth and Oropharynx as per pre-operative assessment

## 2017-03-22 NOTE — Transfer of Care (Signed)
Immediate Anesthesia Transfer of Care Note  Patient: Joel Dudley  Procedure(s) Performed: ESOPHAGOGASTRODUODENOSCOPY (EGD) (N/A )  Patient Location: Endoscopy Unit  Anesthesia Type:General  Level of Consciousness: drowsy  Airway & Oxygen Therapy: Patient Spontanous Breathing and Patient connected to nasal cannula oxygen  Post-op Assessment: Report given to RN and Post -op Vital signs reviewed and stable  Post vital signs: Reviewed and stable  Last Vitals:  Vitals:   03/22/17 1117 03/22/17 1224  BP: 134/88 113/84  Pulse: 74 80  Resp: 18 (!) 21  Temp: 36.8 C   SpO2: 98% 100%    Last Pain:  Vitals:   03/22/17 1117  TempSrc: Oral         Complications: No apparent anesthesia complications

## 2017-03-22 NOTE — Anesthesia Preprocedure Evaluation (Signed)
Anesthesia Evaluation  Patient identified by MRN, date of birth, ID band Patient awake    History of Anesthesia Complications Negative for: history of anesthetic complications  Airway Mallampati: I  TM Distance: >3 FB Neck ROM: Full    Dental  (+) Teeth Intact   Pulmonary neg pulmonary ROS,    breath sounds clear to auscultation       Cardiovascular negative cardio ROS   Rhythm:Regular     Neuro/Psych  Headaches, Seizures -,  PSYCHIATRIC DISORDERS Depression CP with MR    GI/Hepatic Possible GI bleed   Endo/Other  negative endocrine ROS  Renal/GU negative Renal ROS     Musculoskeletal   Abdominal   Peds  Hematology  (+) anemia ,   Anesthesia Other Findings   Reproductive/Obstetrics                             Anesthesia Physical Anesthesia Plan  ASA: II  Anesthesia Plan: General   Post-op Pain Management:    Induction: Intravenous  PONV Risk Score and Plan: 2  Airway Management Planned: Oral ETT  Additional Equipment: None  Intra-op Plan:   Post-operative Plan: Extubation in OR  Informed Consent: I have reviewed the patients History and Physical, chart, labs and discussed the procedure including the risks, benefits and alternatives for the proposed anesthesia with the patient or authorized representative who has indicated his/her understanding and acceptance.   Dental advisory given  Plan Discussed with: CRNA and Surgeon  Anesthesia Plan Comments:         Anesthesia Quick Evaluation

## 2017-03-22 NOTE — Anesthesia Postprocedure Evaluation (Signed)
Anesthesia Post Note  Patient: Joel Dudley  Procedure(s) Performed: ESOPHAGOGASTRODUODENOSCOPY (EGD) (N/A )     Patient location during evaluation: Endoscopy Anesthesia Type: General Level of consciousness: awake and patient cooperative Pain management: pain level controlled Vital Signs Assessment: post-procedure vital signs reviewed and stable Respiratory status: spontaneous breathing, nonlabored ventilation, respiratory function stable and patient connected to nasal cannula oxygen Cardiovascular status: blood pressure returned to baseline and stable Postop Assessment: no apparent nausea or vomiting Anesthetic complications: no    Last Vitals:  Vitals:   03/22/17 1255 03/22/17 1305  BP: (!) 174/95 (!) 142/102  Pulse: 79 85  Resp: (!) 24 (!) 25  Temp:    SpO2: 97% 99%    Last Pain:  Vitals:   03/22/17 1235  TempSrc: Oral  PainSc: Asleep                 Ineta Sinning

## 2017-03-22 NOTE — Progress Notes (Signed)
Joel Dudley 11:37 AM  Subjective: Patient without any new complaints since he was seen by me recently in the office and no signs of bleeding and his case again discussed with his parents  Objective: Vital signs stable afebrile exam please see preassessment evaluation Name onset anemia  Assessment: New-onset anemia in patient on lots of aspirin and nonsteroidals  Plan: Okay to proceed with endoscopy with anesthesia assistance and will proceed with repeat CBC today as well  [facility]  Pager [phone] After 5PM or if no answer call [phone]

## 2017-03-22 NOTE — Discharge Instructions (Signed)
Call if question or problem otherwise begin with soft solids and slowly advance diet as tolerated and pick up iron and omeprazole prescription both are once a day and will decide follow-up once we get his biopsy report and decide how to proceed.  NO aspirin, ibuprofen, alieve, naproxin, or NSAIDs.  Take tylenol for pain.   YOU HAD AN ENDOSCOPIC PROCEDURE TODAY: Refer to the procedure report and other information in the discharge instructions given to you for any specific questions about what was found during the examination. If this information does not answer your questions, please call [facility] office at [phone] to clarify.   YOU SHOULD EXPECT: Some feelings of bloating in the abdomen. Passage of more gas than usual. Walking can help get rid of the air that was put into your GI tract during the procedure and reduce the bloating. If you had a lower endoscopy (such as a colonoscopy or flexible sigmoidoscopy) you may notice spotting of blood in your stool or on the toilet paper. Some abdominal soreness may be present for a day or two, also.  DIET: Your first meal following the procedure should be a light meal and then it is ok to progress to your normal diet. A half-sandwich or bowl of soup is an example of a good first meal. Heavy or fried foods are harder to digest and may make you feel nauseous or bloated. Drink plenty of fluids but you should avoid alcoholic beverages for 24 hours. If you had a esophageal dilation, please see attached instructions for diet.   ACTIVITY: Your care partner should take you home directly after the procedure. You should plan to take it easy, moving slowly for the rest of the day. You can resume normal activity the day after the procedure however YOU SHOULD NOT DRIVE, use power tools, machinery or perform tasks that involve climbing or major physical exertion for 24 hours (because of the sedation medicines used during the test).   SYMPTOMS TO REPORT IMMEDIATELY: A  gastroenterologist can be reached at any hour. Please call [phone]  for any of the following symptoms:  Following lower endoscopy (colonoscopy, flexible sigmoidoscopy) Excessive amounts of blood in the stool  Significant tenderness, worsening of abdominal pains  Swelling of the abdomen that is new, acute  Fever of 100 or higher  Following upper endoscopy (EGD, EUS, ERCP, esophageal dilation) Vomiting of blood or coffee ground material  New, significant abdominal pain  New, significant chest pain or pain under the shoulder blades  Painful or persistently difficult swallowing  New shortness of breath  Black, tarry-looking or red, bloody stools  FOLLOW UP:  If any biopsies were taken you will be contacted by phone or by letter within the next 1-3 weeks. Call [phone]  if you have not heard about the biopsies in 3 weeks.  Please also call with any specific questions about appointments or follow up tests.

## 2017-03-24 ENCOUNTER — Encounter (HOSPITAL_COMMUNITY): Payer: Self-pay | Admitting: Gastroenterology

## 2017-03-25 DIAGNOSIS — K2270 Barrett's esophagus without dysplasia: Secondary | ICD-10-CM | POA: Insufficient documentation

## 2017-08-21 ENCOUNTER — Telehealth: Payer: Self-pay | Admitting: Family Medicine

## 2017-08-21 NOTE — Telephone Encounter (Signed)
Rx mailed to mom

## 2017-08-21 NOTE — Telephone Encounter (Signed)
   Joel Dudley called  [name] got new wheelchair a while back and she needs new lock down bar   Needs rx for  Easy lock down bar for under wheelchair  wheelchair model # [id number] Serial # [id number]  Mail rx to [place] (mother)

## 2017-10-11 DIAGNOSIS — D500 Iron deficiency anemia secondary to blood loss (chronic): Secondary | ICD-10-CM | POA: Insufficient documentation

## 2017-10-11 DIAGNOSIS — K2100 Gastro-esophageal reflux disease with esophagitis, without bleeding: Secondary | ICD-10-CM | POA: Insufficient documentation

## 2017-10-21 DIAGNOSIS — K2270 Barrett's esophagus without dysplasia: Secondary | ICD-10-CM

## 2017-10-21 DIAGNOSIS — D509 Iron deficiency anemia, unspecified: Secondary | ICD-10-CM | POA: Diagnosis not present

## 2017-10-21 DIAGNOSIS — K219 Gastro-esophageal reflux disease without esophagitis: Secondary | ICD-10-CM

## 2017-10-21 DIAGNOSIS — D62 Acute posthemorrhagic anemia: Secondary | ICD-10-CM | POA: Diagnosis not present

## 2017-10-22 DIAGNOSIS — K219 Gastro-esophageal reflux disease without esophagitis: Secondary | ICD-10-CM | POA: Diagnosis not present

## 2017-10-22 DIAGNOSIS — D509 Iron deficiency anemia, unspecified: Secondary | ICD-10-CM | POA: Diagnosis not present

## 2017-10-22 DIAGNOSIS — D62 Acute posthemorrhagic anemia: Secondary | ICD-10-CM | POA: Diagnosis not present

## 2017-10-22 DIAGNOSIS — K2270 Barrett's esophagus without dysplasia: Secondary | ICD-10-CM | POA: Diagnosis not present

## 2017-10-23 ENCOUNTER — Telehealth: Payer: Self-pay | Admitting: Family Medicine

## 2017-10-23 DIAGNOSIS — K639 Disease of intestine, unspecified: Secondary | ICD-10-CM

## 2017-10-23 DIAGNOSIS — D62 Acute posthemorrhagic anemia: Secondary | ICD-10-CM | POA: Diagnosis not present

## 2017-10-23 LAB — HM COLONOSCOPY

## 2017-10-23 NOTE — Telephone Encounter (Signed)
Called mom regarding document from [facility] and pcp note.  No answer, left message to call.

## 2017-10-24 DIAGNOSIS — K639 Disease of intestine, unspecified: Secondary | ICD-10-CM | POA: Diagnosis not present

## 2017-10-24 DIAGNOSIS — D62 Acute posthemorrhagic anemia: Secondary | ICD-10-CM | POA: Diagnosis not present

## 2017-10-25 ENCOUNTER — Encounter: Payer: Self-pay | Admitting: Family Medicine

## 2017-10-25 DIAGNOSIS — D62 Acute posthemorrhagic anemia: Secondary | ICD-10-CM | POA: Diagnosis not present

## 2017-10-25 DIAGNOSIS — K639 Disease of intestine, unspecified: Secondary | ICD-10-CM | POA: Diagnosis not present

## 2017-10-25 HISTORY — PX: OTHER SURGICAL HISTORY: SHX169

## 2017-10-26 DIAGNOSIS — K639 Disease of intestine, unspecified: Secondary | ICD-10-CM | POA: Diagnosis not present

## 2017-10-26 DIAGNOSIS — D62 Acute posthemorrhagic anemia: Secondary | ICD-10-CM | POA: Diagnosis not present

## 2017-10-27 DIAGNOSIS — D62 Acute posthemorrhagic anemia: Secondary | ICD-10-CM | POA: Diagnosis not present

## 2017-10-27 DIAGNOSIS — K639 Disease of intestine, unspecified: Secondary | ICD-10-CM | POA: Diagnosis not present

## 2017-10-28 DIAGNOSIS — K639 Disease of intestine, unspecified: Secondary | ICD-10-CM | POA: Diagnosis not present

## 2017-10-28 DIAGNOSIS — D62 Acute posthemorrhagic anemia: Secondary | ICD-10-CM | POA: Diagnosis not present

## 2017-10-29 ENCOUNTER — Encounter: Payer: Self-pay | Admitting: Family Medicine

## 2017-10-29 DIAGNOSIS — D62 Acute posthemorrhagic anemia: Secondary | ICD-10-CM | POA: Diagnosis not present

## 2017-10-29 DIAGNOSIS — K639 Disease of intestine, unspecified: Secondary | ICD-10-CM | POA: Diagnosis not present

## 2017-11-11 ENCOUNTER — Encounter: Payer: Self-pay | Admitting: Family Medicine

## 2017-11-11 DIAGNOSIS — Z8049 Family history of malignant neoplasm of other genital organs: Secondary | ICD-10-CM | POA: Diagnosis not present

## 2017-11-11 DIAGNOSIS — Z801 Family history of malignant neoplasm of trachea, bronchus and lung: Secondary | ICD-10-CM

## 2017-11-11 DIAGNOSIS — C187 Malignant neoplasm of sigmoid colon: Secondary | ICD-10-CM

## 2017-11-11 DIAGNOSIS — Z808 Family history of malignant neoplasm of other organs or systems: Secondary | ICD-10-CM

## 2017-11-11 DIAGNOSIS — Z800 Family history of malignant neoplasm of digestive organs: Secondary | ICD-10-CM

## 2017-11-13 ENCOUNTER — Encounter: Payer: Self-pay | Admitting: Family Medicine

## 2017-12-24 DIAGNOSIS — Z801 Family history of malignant neoplasm of trachea, bronchus and lung: Secondary | ICD-10-CM

## 2017-12-24 DIAGNOSIS — K2270 Barrett's esophagus without dysplasia: Secondary | ICD-10-CM

## 2017-12-24 DIAGNOSIS — C187 Malignant neoplasm of sigmoid colon: Secondary | ICD-10-CM

## 2017-12-24 DIAGNOSIS — Z9049 Acquired absence of other specified parts of digestive tract: Secondary | ICD-10-CM | POA: Diagnosis not present

## 2017-12-24 DIAGNOSIS — Z800 Family history of malignant neoplasm of digestive organs: Secondary | ICD-10-CM

## 2017-12-24 DIAGNOSIS — Z8049 Family history of malignant neoplasm of other genital organs: Secondary | ICD-10-CM

## 2017-12-24 DIAGNOSIS — G809 Cerebral palsy, unspecified: Secondary | ICD-10-CM | POA: Diagnosis not present

## 2017-12-24 DIAGNOSIS — Z808 Family history of malignant neoplasm of other organs or systems: Secondary | ICD-10-CM

## 2018-01-09 ENCOUNTER — Ambulatory Visit (INDEPENDENT_AMBULATORY_CARE_PROVIDER_SITE_OTHER): Payer: Medicaid Other | Admitting: Family Medicine

## 2018-01-09 ENCOUNTER — Encounter: Payer: Self-pay | Admitting: Family Medicine

## 2018-01-09 VITALS — BP 110/70 | HR 72 | Temp 97.8°F

## 2018-01-09 DIAGNOSIS — Z23 Encounter for immunization: Secondary | ICD-10-CM | POA: Diagnosis not present

## 2018-01-09 DIAGNOSIS — Z0000 Encounter for general adult medical examination without abnormal findings: Secondary | ICD-10-CM

## 2018-01-09 DIAGNOSIS — C189 Malignant neoplasm of colon, unspecified: Secondary | ICD-10-CM

## 2018-01-09 DIAGNOSIS — G808 Other cerebral palsy: Secondary | ICD-10-CM | POA: Diagnosis not present

## 2018-01-09 NOTE — Progress Notes (Signed)
   Subjective:    Patient ID: Joel Dudley, male    DOB: 25-Dec-1970, 47 y.o.   MRN: [id number]  HPI He is here for complete examination.  He has had quite an eventful several months.  He had some bright red rectal bleeding and went to the emergency room and Accupril.  He was subsequently found to have colon cancer and did have surgery.  He is recovered nicely from the surgery and did start chemotherapy.  He did not tolerate the chemotherapy.  Subsequently the Port-A-Cath was removed.  According to the parents the need for the chemotherapy was equivocal.  Joel Dudley is certainly aware of the potential risk of him dying from this.  He was seen recently by Dr. Gershon Mussel and given Botox injections which has quieted down his movements.  He has no other concerns.  His mother does help with translation as his speech pattern is difficult to understand. Social and family history as well as health maintenance and immunizations were reviewed.   Review of Systems     Objective:   Physical Exam Alert and in no distress. Tympanic membranes and canals are normal. Pharyngeal area is normal. Neck is supple without adenopathy or thyromegaly. Cardiac exam shows a regular sinus rhythm without murmurs or gallops. Lungs are clear to auscultation. Abdominal exam shows the wounds to be healing quite nicely.       Assessment & Plan:  Routine general medical examination at a health care facility  Need for influenza vaccination - Plan: Flu Vaccine QUAD 6+ mos PF IM (Fluarix Quad PF)  Mixed spastic athetoid cerebral palsy ([place])  Malignant neoplasm of colon, unspecified part of colon ([place]) In general Joel Dudley is quite aware of his diagnosis and does admit to recognizing the possibility of his death.  I explained to him and his parents that I would work with them in any way needed to help him through this.

## 2018-03-26 DIAGNOSIS — C187 Malignant neoplasm of sigmoid colon: Secondary | ICD-10-CM

## 2018-03-26 DIAGNOSIS — Z9049 Acquired absence of other specified parts of digestive tract: Secondary | ICD-10-CM

## 2018-03-26 DIAGNOSIS — K769 Liver disease, unspecified: Secondary | ICD-10-CM

## 2018-04-09 DIAGNOSIS — Z85038 Personal history of other malignant neoplasm of large intestine: Secondary | ICD-10-CM

## 2018-04-09 DIAGNOSIS — K769 Liver disease, unspecified: Secondary | ICD-10-CM | POA: Diagnosis not present

## 2018-04-09 DIAGNOSIS — Z9049 Acquired absence of other specified parts of digestive tract: Secondary | ICD-10-CM

## 2018-06-04 DIAGNOSIS — Z85048 Personal history of other malignant neoplasm of rectum, rectosigmoid junction, and anus: Secondary | ICD-10-CM | POA: Diagnosis not present

## 2018-06-18 DIAGNOSIS — Z885 Allergy status to narcotic agent status: Secondary | ICD-10-CM | POA: Diagnosis not present

## 2018-06-18 DIAGNOSIS — G803 Athetoid cerebral palsy: Secondary | ICD-10-CM | POA: Diagnosis not present

## 2018-06-18 DIAGNOSIS — G808 Other cerebral palsy: Secondary | ICD-10-CM | POA: Diagnosis not present

## 2018-06-18 DIAGNOSIS — Z880 Allergy status to penicillin: Secondary | ICD-10-CM | POA: Diagnosis not present

## 2018-06-30 DIAGNOSIS — C189 Malignant neoplasm of colon, unspecified: Secondary | ICD-10-CM | POA: Diagnosis not present

## 2018-06-30 DIAGNOSIS — R079 Chest pain, unspecified: Secondary | ICD-10-CM | POA: Diagnosis not present

## 2018-06-30 DIAGNOSIS — R0789 Other chest pain: Secondary | ICD-10-CM | POA: Diagnosis not present

## 2018-07-01 DIAGNOSIS — R079 Chest pain, unspecified: Secondary | ICD-10-CM | POA: Diagnosis not present

## 2018-07-24 DIAGNOSIS — C189 Malignant neoplasm of colon, unspecified: Secondary | ICD-10-CM | POA: Diagnosis not present

## 2018-07-24 DIAGNOSIS — C187 Malignant neoplasm of sigmoid colon: Secondary | ICD-10-CM | POA: Diagnosis not present

## 2018-07-25 DIAGNOSIS — C187 Malignant neoplasm of sigmoid colon: Secondary | ICD-10-CM | POA: Diagnosis not present

## 2018-07-25 DIAGNOSIS — Z85038 Personal history of other malignant neoplasm of large intestine: Secondary | ICD-10-CM | POA: Diagnosis not present

## 2018-07-25 DIAGNOSIS — R970 Elevated carcinoembryonic antigen [CEA]: Secondary | ICD-10-CM | POA: Diagnosis not present

## 2018-07-25 DIAGNOSIS — K769 Liver disease, unspecified: Secondary | ICD-10-CM | POA: Diagnosis not present

## 2018-08-06 DIAGNOSIS — C187 Malignant neoplasm of sigmoid colon: Secondary | ICD-10-CM | POA: Diagnosis not present

## 2018-08-06 DIAGNOSIS — R932 Abnormal findings on diagnostic imaging of liver and biliary tract: Secondary | ICD-10-CM | POA: Diagnosis not present

## 2018-08-06 DIAGNOSIS — C189 Malignant neoplasm of colon, unspecified: Secondary | ICD-10-CM | POA: Diagnosis not present

## 2018-08-07 DIAGNOSIS — C187 Malignant neoplasm of sigmoid colon: Secondary | ICD-10-CM | POA: Diagnosis not present

## 2018-08-07 DIAGNOSIS — C787 Secondary malignant neoplasm of liver and intrahepatic bile duct: Secondary | ICD-10-CM | POA: Diagnosis not present

## 2018-12-05 DIAGNOSIS — G803 Athetoid cerebral palsy: Secondary | ICD-10-CM | POA: Diagnosis not present

## 2018-12-09 ENCOUNTER — Telehealth: Payer: Self-pay | Admitting: Family Medicine

## 2018-12-09 NOTE — Telephone Encounter (Signed)
[employer] form to repair mobility device form completed and faxed

## 2019-01-13 ENCOUNTER — Other Ambulatory Visit: Payer: Self-pay

## 2019-01-13 ENCOUNTER — Ambulatory Visit (INDEPENDENT_AMBULATORY_CARE_PROVIDER_SITE_OTHER): Payer: Medicare Other | Admitting: Family Medicine

## 2019-01-13 VITALS — BP 146/98 | HR 68 | Temp 98.0°F

## 2019-01-13 DIAGNOSIS — Z23 Encounter for immunization: Secondary | ICD-10-CM

## 2019-01-13 DIAGNOSIS — Z0000 Encounter for general adult medical examination without abnormal findings: Secondary | ICD-10-CM | POA: Diagnosis not present

## 2019-01-13 DIAGNOSIS — C189 Malignant neoplasm of colon, unspecified: Secondary | ICD-10-CM

## 2019-01-13 DIAGNOSIS — G808 Other cerebral palsy: Secondary | ICD-10-CM

## 2019-01-13 NOTE — Progress Notes (Signed)
   Subjective:    Patient ID: Joel Dudley, male    DOB: [birth year], 48 y.o.   MRN: [id number]  HPI He is here for a complete examination.  Last year he was diagnosed with colon cancer and subsequently found to also have metastases to the liver.  He chose not to have any chemotherapy.  He apparently did try this initially but was not interested in continuing with that approach.  He has seen Dr. Bobby Rumpf for oncology, Dr. Lilia Pro for surgery and Dr. Lyda Jester from the GI and has an appointment with him in the near future. His parents continue to take excellent care of him and his matter fact he apparently has gained some weight during all of this.  He has had some difficulty with constipation and is using a stool softener with good results.  He is using Tylenol for pain control.  He is also recently been seen for his spasticity and Botox is working quite nicely.  There were no other questions or concerns.   Review of Systems     Objective:   Physical Exam Alert and in no distress.  Neck is supple without adenopathy or thyromegaly.  Cardiac exam shows regular rhythm without murmurs or gallops.  Lungs are clear to auscultation.  Abdominal exam shows no hepatosplenomegaly, masses or tenderness.       Assessment & Plan:  Routine general medical examination at a health care facility - Plan: CBC with Differential/Platelet, Comprehensive metabolic panel  Need for influenza vaccination - Plan: Flu Vaccine QUAD 6+ mos PF IM (Fluarix Quad PF)  Mixed spastic athetoid cerebral palsy ([place])  Primary colon cancer with metastasis to other site [facility]) - Plan: CBC with Differential/Platelet, Comprehensive metabolic panel Discussed continuing care and potential need for pain medications and hospice.  Recommended getting further pain management through oncology and if there was an issue with that, they will keep me informed.  Also discussed possible eventual referral to hospice.  All the questions were  answered.

## 2019-01-14 LAB — CBC WITH DIFFERENTIAL/PLATELET
Basophils Absolute: 0.1 10*3/uL (ref 0.0–0.2)
Basos: 2 %
EOS (ABSOLUTE): 0.2 10*3/uL (ref 0.0–0.4)
Eos: 2 %
Hematocrit: 40.2 % (ref 37.5–51.0)
Hemoglobin: 12.9 g/dL — ABNORMAL LOW (ref 13.0–17.7)
Immature Grans (Abs): 0 10*3/uL (ref 0.0–0.1)
Immature Granulocytes: 0 %
Lymphocytes Absolute: 2.1 10*3/uL (ref 0.7–3.1)
Lymphs: 31 %
MCH: 26.7 pg (ref 26.6–33.0)
MCHC: 32.1 g/dL (ref 31.5–35.7)
MCV: 83 fL (ref 79–97)
Monocytes Absolute: 0.5 10*3/uL (ref 0.1–0.9)
Monocytes: 8 %
Neutrophils Absolute: 3.8 10*3/uL (ref 1.4–7.0)
Neutrophils: 57 %
Platelets: 283 10*3/uL (ref 150–450)
RBC: 4.84 x10E6/uL (ref 4.14–5.80)
RDW: 14.3 % (ref 11.6–15.4)
WBC: 6.6 10*3/uL (ref 3.4–10.8)

## 2019-01-14 LAB — COMPREHENSIVE METABOLIC PANEL
ALT: 24 IU/L (ref 0–44)
AST: 29 IU/L (ref 0–40)
Albumin/Globulin Ratio: 1.7 (ref 1.2–2.2)
Albumin: 4.5 g/dL (ref 4.0–5.0)
Alkaline Phosphatase: 99 IU/L (ref 39–117)
BUN/Creatinine Ratio: 9 (ref 9–20)
BUN: 8 mg/dL (ref 6–24)
Bilirubin Total: 0.4 mg/dL (ref 0.0–1.2)
CO2: 22 mmol/L (ref 20–29)
Calcium: 9.4 mg/dL (ref 8.7–10.2)
Chloride: 101 mmol/L (ref 96–106)
Creatinine, Ser: 0.94 mg/dL (ref 0.76–1.27)
GFR calc Af Amer: 112 mL/min/{1.73_m2} (ref >59)
GFR calc non Af Amer: 97 mL/min/{1.73_m2} (ref >59)
Globulin, Total: 2.6 g/dL (ref 1.5–4.5)
Glucose: 88 mg/dL (ref 65–99)
Potassium: 4.2 mmol/L (ref 3.5–5.2)
Sodium: 140 mmol/L (ref 134–144)
Total Protein: 7.1 g/dL (ref 6.0–8.5)

## 2019-01-31 DIAGNOSIS — S20412A Abrasion of left back wall of thorax, initial encounter: Secondary | ICD-10-CM | POA: Diagnosis not present

## 2019-01-31 DIAGNOSIS — R0781 Pleurodynia: Secondary | ICD-10-CM | POA: Diagnosis not present

## 2019-01-31 DIAGNOSIS — S299XXA Unspecified injury of thorax, initial encounter: Secondary | ICD-10-CM | POA: Diagnosis not present

## 2019-01-31 DIAGNOSIS — S20312A Abrasion of left front wall of thorax, initial encounter: Secondary | ICD-10-CM | POA: Diagnosis not present

## 2019-01-31 DIAGNOSIS — S30810A Abrasion of lower back and pelvis, initial encounter: Secondary | ICD-10-CM | POA: Diagnosis not present

## 2019-01-31 DIAGNOSIS — M545 Low back pain: Secondary | ICD-10-CM | POA: Diagnosis not present

## 2019-06-22 DIAGNOSIS — G803 Athetoid cerebral palsy: Secondary | ICD-10-CM | POA: Diagnosis not present

## 2019-06-22 DIAGNOSIS — M6249 Contracture of muscle, multiple sites: Secondary | ICD-10-CM | POA: Diagnosis not present

## 2019-09-18 ENCOUNTER — Ambulatory Visit (INDEPENDENT_AMBULATORY_CARE_PROVIDER_SITE_OTHER): Payer: Medicare Other | Admitting: Family Medicine

## 2019-09-18 VITALS — BP 132/80 | HR 75 | Temp 96.2°F

## 2019-09-18 DIAGNOSIS — R112 Nausea with vomiting, unspecified: Secondary | ICD-10-CM | POA: Diagnosis not present

## 2019-09-18 DIAGNOSIS — H9202 Otalgia, left ear: Secondary | ICD-10-CM | POA: Diagnosis not present

## 2019-09-18 DIAGNOSIS — R05 Cough: Secondary | ICD-10-CM | POA: Diagnosis not present

## 2019-09-18 DIAGNOSIS — C189 Malignant neoplasm of colon, unspecified: Secondary | ICD-10-CM | POA: Diagnosis not present

## 2019-09-18 DIAGNOSIS — R059 Cough, unspecified: Secondary | ICD-10-CM

## 2019-09-18 NOTE — Progress Notes (Signed)
   Subjective:    Patient ID: Joel Dudley, male    DOB: 10/30/1970, 49 y.o.   MRN: [id number]  HPI He is here for evaluation of possible left ear infection.  Apparently he does complain of some slight left ear pain but no fever or chills.  He has also had some difficulty with coughing and gagging with vomiting.  Apparently this is been going on the last several months.  He does have an underlying history of colon cancer with surgery.  Apparently recent scans did show metastases to the liver.  Apparently his bowel habits are good.   Review of Systems     Objective:   Physical Exam Alert and in no distress.  The left earlobe shows chronic changes from infection.  Canal appears normal.  Cerumen was in there making evaluation of the TM difficult.  Right TM and canal normal.       Assessment & Plan:  Cough  Nausea and vomiting, intractability of vomiting not specified, unspecified vomiting type  Primary colon cancer with metastasis to other site [facility])  Left ear pain I explained that I did not see anything to treat with an antibiotic.  And then discussed with cough, nausea and vomiting.  With the underlying history of metastasis, further GI evaluation I think is appropriate.  They will call the specialist for an appointment.

## 2019-11-25 DIAGNOSIS — R1310 Dysphagia, unspecified: Secondary | ICD-10-CM | POA: Diagnosis not present

## 2019-11-25 DIAGNOSIS — K5900 Constipation, unspecified: Secondary | ICD-10-CM | POA: Diagnosis not present

## 2019-11-30 DIAGNOSIS — K2270 Barrett's esophagus without dysplasia: Secondary | ICD-10-CM | POA: Diagnosis not present

## 2019-11-30 DIAGNOSIS — Z85038 Personal history of other malignant neoplasm of large intestine: Secondary | ICD-10-CM | POA: Diagnosis not present

## 2019-11-30 DIAGNOSIS — Z87820 Personal history of traumatic brain injury: Secondary | ICD-10-CM | POA: Diagnosis not present

## 2019-11-30 DIAGNOSIS — Z915 Personal history of self-harm: Secondary | ICD-10-CM | POA: Diagnosis not present

## 2019-11-30 DIAGNOSIS — R1310 Dysphagia, unspecified: Secondary | ICD-10-CM | POA: Diagnosis not present

## 2019-11-30 DIAGNOSIS — G803 Athetoid cerebral palsy: Secondary | ICD-10-CM | POA: Diagnosis not present

## 2019-11-30 DIAGNOSIS — C229 Malignant neoplasm of liver, not specified as primary or secondary: Secondary | ICD-10-CM | POA: Diagnosis not present

## 2019-11-30 DIAGNOSIS — Z993 Dependence on wheelchair: Secondary | ICD-10-CM | POA: Diagnosis not present

## 2020-01-13 DIAGNOSIS — G803 Athetoid cerebral palsy: Secondary | ICD-10-CM | POA: Diagnosis not present

## 2020-01-13 DIAGNOSIS — C189 Malignant neoplasm of colon, unspecified: Secondary | ICD-10-CM | POA: Diagnosis not present

## 2020-01-13 DIAGNOSIS — Z993 Dependence on wheelchair: Secondary | ICD-10-CM | POA: Diagnosis not present

## 2020-03-21 ENCOUNTER — Other Ambulatory Visit: Payer: Self-pay

## 2020-03-21 ENCOUNTER — Other Ambulatory Visit (INDEPENDENT_AMBULATORY_CARE_PROVIDER_SITE_OTHER): Payer: Medicare Other

## 2020-03-21 DIAGNOSIS — Z23 Encounter for immunization: Secondary | ICD-10-CM | POA: Diagnosis not present

## 2020-05-11 ENCOUNTER — Other Ambulatory Visit: Payer: Self-pay

## 2020-05-11 ENCOUNTER — Encounter: Payer: Self-pay | Admitting: Family Medicine

## 2020-05-11 ENCOUNTER — Ambulatory Visit (INDEPENDENT_AMBULATORY_CARE_PROVIDER_SITE_OTHER): Payer: Medicare Other | Admitting: Family Medicine

## 2020-05-11 VITALS — BP 130/84 | HR 71 | Temp 96.8°F

## 2020-05-11 DIAGNOSIS — G808 Other cerebral palsy: Secondary | ICD-10-CM | POA: Diagnosis not present

## 2020-05-11 DIAGNOSIS — Z1159 Encounter for screening for other viral diseases: Secondary | ICD-10-CM | POA: Diagnosis not present

## 2020-05-11 DIAGNOSIS — Z13220 Encounter for screening for lipoid disorders: Secondary | ICD-10-CM | POA: Diagnosis not present

## 2020-05-11 DIAGNOSIS — C189 Malignant neoplasm of colon, unspecified: Secondary | ICD-10-CM

## 2020-05-11 DIAGNOSIS — Z0000 Encounter for general adult medical examination without abnormal findings: Secondary | ICD-10-CM | POA: Diagnosis not present

## 2020-05-11 LAB — COMPREHENSIVE METABOLIC PANEL
AST: 23 IU/L (ref 0–40)
BUN/Creatinine Ratio: 10 (ref 9–20)
Chloride: 101 mmol/L (ref 96–106)
Glucose: 88 mg/dL (ref 65–99)

## 2020-05-11 LAB — CBC WITH DIFFERENTIAL/PLATELET

## 2020-05-11 LAB — LIPID PANEL

## 2020-05-11 LAB — HEPATITIS C ANTIBODY

## 2020-05-11 NOTE — Progress Notes (Signed)
   Subjective:    Patient ID: Joel Dudley, male    DOB: 1971-02-09, 50 y.o.   MRN: [id number]  HPI He is here for complete examination.  His mother continues to take excellent care of him.  He was diagnosed with colon cancer in 2019, did get chemo as well as radiation however he decided to stop it because he had unacceptable side effects from that.  He has had no abdominal pain, nausea, vomiting, weight change, blood in his stool.  He takes aspirin and a multivitamin.  He is followed in [place] for his CPE and does occasionally get Botox injections.  Otherwise family and social history as well as health maintenance immunizations was reviewed.   Review of Systems     Objective:   Physical Exam Alert and in no distress. Tympanic membranes and canals are normal. Pharyngeal area is normal. Neck is supple without adenopathy or thyromegaly. Cardiac exam shows a regular sinus rhythm without murmurs or gallops. Lungs are clear to auscultation. Abdominal exam shows no masses or tenderness.       Assessment & Plan:  Routine general medical examination at a health care facility  Mixed spastic athetoid cerebral palsy ([facility])  Congenital quadriplegia ([facility])  Primary colon cancer with metastasis to other site [facility]) - Plan: CBC with Differential/Platelet, Comprehensive metabolic panel  Need for hepatitis C screening test - Plan: Hepatitis C antibody  Screening for lipid disorders - Plan: Lipid panel

## 2020-05-12 LAB — COMPREHENSIVE METABOLIC PANEL
ALT: 17 IU/L (ref 0–44)
Albumin/Globulin Ratio: 1.4 (ref 1.2–2.2)
Albumin: 4 g/dL (ref 4.0–5.0)
Alkaline Phosphatase: 175 IU/L — ABNORMAL HIGH (ref 44–121)
BUN: 9 mg/dL (ref 6–24)
Bilirubin Total: 0.4 mg/dL (ref 0.0–1.2)
CO2: 23 mmol/L (ref 20–29)
Calcium: 9.6 mg/dL (ref 8.7–10.2)
Creatinine, Ser: 0.87 mg/dL (ref 0.76–1.27)
GFR calc Af Amer: 119 mL/min/{1.73_m2} (ref >59)
GFR calc non Af Amer: 103 mL/min/{1.73_m2} (ref >59)
Globulin, Total: 2.9 g/dL (ref 1.5–4.5)
Potassium: 4.4 mmol/L (ref 3.5–5.2)
Sodium: 139 mmol/L (ref 134–144)
Total Protein: 6.9 g/dL (ref 6.0–8.5)

## 2020-05-12 LAB — CBC WITH DIFFERENTIAL/PLATELET
Basophils Absolute: 0.1 10*3/uL (ref 0.0–0.2)
Basos: 1 %
EOS (ABSOLUTE): 0.2 10*3/uL (ref 0.0–0.4)
Eos: 3 %
Hematocrit: 39.1 % (ref 37.5–51.0)
Hemoglobin: 12.7 g/dL — ABNORMAL LOW (ref 13.0–17.7)
Immature Grans (Abs): 0 10*3/uL (ref 0.0–0.1)
Immature Granulocytes: 0 %
Lymphocytes Absolute: 1.8 10*3/uL (ref 0.7–3.1)
Lymphs: 24 %
MCH: 27.3 pg (ref 26.6–33.0)
MCHC: 32.5 g/dL (ref 31.5–35.7)
MCV: 84 fL (ref 79–97)
Monocytes Absolute: 0.7 10*3/uL (ref 0.1–0.9)
Neutrophils Absolute: 4.5 10*3/uL (ref 1.4–7.0)
Platelets: 257 10*3/uL (ref 150–450)
RBC: 4.66 x10E6/uL (ref 4.14–5.80)
RDW: 12.7 % (ref 11.6–15.4)
WBC: 7.3 10*3/uL (ref 3.4–10.8)

## 2020-05-12 LAB — LIPID PANEL
HDL: 37 mg/dL — ABNORMAL LOW (ref >39)
Triglycerides: 61 mg/dL (ref 0–149)
VLDL Cholesterol Cal: 12 mg/dL (ref 5–40)

## 2020-06-06 ENCOUNTER — Other Ambulatory Visit: Payer: Self-pay

## 2020-06-06 ENCOUNTER — Ambulatory Visit (INDEPENDENT_AMBULATORY_CARE_PROVIDER_SITE_OTHER): Payer: Medicare Other | Admitting: Family Medicine

## 2020-06-06 VITALS — BP 126/86 | HR 96 | Temp 98.9°F

## 2020-06-06 DIAGNOSIS — L03115 Cellulitis of right lower limb: Secondary | ICD-10-CM | POA: Diagnosis not present

## 2020-06-06 MED ORDER — DOXYCYCLINE HYCLATE 100 MG PO TABS: 100.0000 mg | ORAL_TABLET | Freq: Two times a day (BID) | ORAL | 0 refills | Status: AC

## 2020-06-06 MED ORDER — CEPHALEXIN 500 MG PO CAPS: 500.0000 mg | ORAL_CAPSULE | Freq: Four times a day (QID) | ORAL | 0 refills | Status: DC

## 2020-06-06 NOTE — Patient Instructions (Signed)
Take a picture of the foot now and then take another picture Thursday and send them both to [place] through my chart and will make a decision what to do then.  If he gets worse instead of better then you need to go to the hospital

## 2020-06-06 NOTE — Progress Notes (Signed)
   Subjective:    Patient ID: Joel Dudley, male    DOB: 1971-01-21, 50 y.o.   MRN: [id number]  HPI He is here for evaluation of swelling slight erythema and tenderness to the dorsum of the right foot.  This is been going on for the last several days.  His mother just noticed it earlier today.  He has a previous history of difficulty with cellulitis of his feet.   Review of Systems     Objective:   Physical Exam Alert and in no distress.  Exam of the right foot dorsal surface does show erythema warmth and tenderness and there is also some warmth and slight erythema to the medial aspect of the calf.  No tenderness or swelling above the knee.       Assessment & Plan:  Cellulitis of right lower extremity - Plan: doxycycline (VIBRA-TABS) 100 MG tablet, DISCONTINUED: cephALEXin (KEFLEX) 500 MG capsule He has been on a cephalosporin in the past but he is also been on doxycycline so to avoid any potential penicillin reactivity I will place him on doxycycline.  The mother will take a picture of his foot today and then again on Thursday and then send it to me via [place].  Explained that if the symptoms get worse, he is to go to the hospital.  She was comfortable with that.

## 2020-06-07 ENCOUNTER — Telehealth: Payer: Self-pay

## 2020-06-07 NOTE — Telephone Encounter (Signed)
Joel Dudley called and said foot is doing better and she will call back Thursday with update

## 2020-06-27 DIAGNOSIS — M62432 Contracture of muscle, left forearm: Secondary | ICD-10-CM | POA: Diagnosis not present

## 2020-06-27 DIAGNOSIS — G808 Other cerebral palsy: Secondary | ICD-10-CM | POA: Diagnosis not present

## 2020-06-27 DIAGNOSIS — M62412 Contracture of muscle, left shoulder: Secondary | ICD-10-CM | POA: Diagnosis not present

## 2020-06-27 DIAGNOSIS — M205X1 Other deformities of toe(s) (acquired), right foot: Secondary | ICD-10-CM | POA: Diagnosis not present

## 2020-06-27 DIAGNOSIS — G803 Athetoid cerebral palsy: Secondary | ICD-10-CM | POA: Diagnosis not present

## 2020-06-27 DIAGNOSIS — M62411 Contracture of muscle, right shoulder: Secondary | ICD-10-CM | POA: Diagnosis not present

## 2020-06-27 DIAGNOSIS — M205X9 Other deformities of toe(s) (acquired), unspecified foot: Secondary | ICD-10-CM | POA: Diagnosis not present

## 2020-09-20 DIAGNOSIS — R109 Unspecified abdominal pain: Secondary | ICD-10-CM | POA: Diagnosis not present

## 2020-09-20 DIAGNOSIS — R17 Unspecified jaundice: Secondary | ICD-10-CM | POA: Diagnosis not present

## 2020-09-20 DIAGNOSIS — R1110 Vomiting, unspecified: Secondary | ICD-10-CM | POA: Diagnosis not present

## 2020-09-20 DIAGNOSIS — C787 Secondary malignant neoplasm of liver and intrahepatic bile duct: Secondary | ICD-10-CM | POA: Diagnosis not present

## 2020-09-21 DIAGNOSIS — C187 Malignant neoplasm of sigmoid colon: Secondary | ICD-10-CM | POA: Diagnosis not present

## 2020-09-21 DIAGNOSIS — G809 Cerebral palsy, unspecified: Secondary | ICD-10-CM | POA: Diagnosis present

## 2020-09-21 DIAGNOSIS — C787 Secondary malignant neoplasm of liver and intrahepatic bile duct: Secondary | ICD-10-CM | POA: Diagnosis not present

## 2020-09-21 DIAGNOSIS — E871 Hypo-osmolality and hyponatremia: Secondary | ICD-10-CM | POA: Diagnosis present

## 2020-09-21 DIAGNOSIS — R1110 Vomiting, unspecified: Secondary | ICD-10-CM | POA: Diagnosis not present

## 2020-09-21 DIAGNOSIS — K831 Obstruction of bile duct: Secondary | ICD-10-CM | POA: Diagnosis present

## 2020-09-21 DIAGNOSIS — R109 Unspecified abdominal pain: Secondary | ICD-10-CM | POA: Diagnosis not present

## 2020-09-21 DIAGNOSIS — K2270 Barrett's esophagus without dysplasia: Secondary | ICD-10-CM | POA: Diagnosis present

## 2020-09-21 DIAGNOSIS — Z7189 Other specified counseling: Secondary | ICD-10-CM | POA: Diagnosis not present

## 2020-09-21 DIAGNOSIS — Z66 Do not resuscitate: Secondary | ICD-10-CM | POA: Diagnosis not present

## 2020-09-21 DIAGNOSIS — K5900 Constipation, unspecified: Secondary | ICD-10-CM | POA: Diagnosis present

## 2020-09-21 DIAGNOSIS — I81 Portal vein thrombosis: Secondary | ICD-10-CM | POA: Diagnosis present

## 2020-09-21 DIAGNOSIS — I820 Budd-Chiari syndrome: Secondary | ICD-10-CM | POA: Diagnosis present

## 2020-09-21 DIAGNOSIS — C189 Malignant neoplasm of colon, unspecified: Secondary | ICD-10-CM | POA: Diagnosis not present

## 2020-09-21 DIAGNOSIS — R17 Unspecified jaundice: Secondary | ICD-10-CM | POA: Diagnosis not present

## 2020-09-21 DIAGNOSIS — Z515 Encounter for palliative care: Secondary | ICD-10-CM | POA: Diagnosis not present

## 2020-09-21 DIAGNOSIS — D649 Anemia, unspecified: Secondary | ICD-10-CM | POA: Diagnosis present

## 2020-09-22 ENCOUNTER — Telehealth: Payer: Self-pay | Admitting: Family Medicine

## 2020-09-22 DIAGNOSIS — C189 Malignant neoplasm of colon, unspecified: Secondary | ICD-10-CM | POA: Diagnosis not present

## 2020-09-22 DIAGNOSIS — K831 Obstruction of bile duct: Secondary | ICD-10-CM | POA: Diagnosis not present

## 2020-09-22 DIAGNOSIS — C787 Secondary malignant neoplasm of liver and intrahepatic bile duct: Secondary | ICD-10-CM | POA: Diagnosis not present

## 2020-09-22 NOTE — Telephone Encounter (Signed)
Pt mom send message over answering machine stating that pt had severe liver damage and was having surgery today, and was preparing for comfort care,  I tried to call to get more information but had to leave message with pt mom

## 2020-09-22 NOTE — Telephone Encounter (Signed)
Joel Dudley pt mom called and would like u to call her regarding Joel Dudley She can be reached at [phone]

## 2020-09-26 ENCOUNTER — Telehealth: Payer: Self-pay | Admitting: Family Medicine

## 2020-09-26 NOTE — Telephone Encounter (Signed)
Called mom, I could hear Jsiah in the back ground.  She said they got home from hospital on Thursday and were doing ok.  She said had to use the itch medicine as the tumor on his back was itching.  They have not had to use any pain meds yet.  I gave her my name and advised her to let me know what they needed. She said she took Gustav to church yesterday and that was good.  I told her to tell Lavan we loved him and would be praying for him and her.  [name] said thank you.  [name] said she had picked up all 3 meds you ordered and said to tell you thank you.

## 2020-12-05 ENCOUNTER — Telehealth: Payer: Self-pay | Admitting: Family Medicine

## 2020-12-05 MED ORDER — DIPHENHYDRAMINE HCL 25 MG PO CAPS: 25.0000 mg | ORAL_CAPSULE | Freq: Four times a day (QID) | ORAL | 1 refills | Status: DC | PRN

## 2020-12-05 NOTE — Telephone Encounter (Signed)
Joel Dudley called and states Joel Dudley feels worse everyday.  Still itching.  Needs refill Benedryl to [facility] in [facility].

## 2020-12-05 NOTE — Telephone Encounter (Signed)
Called mom to see how Derin was, reached voice mail lmtrc.

## 2020-12-28 ENCOUNTER — Telehealth: Payer: Self-pay

## 2020-12-28 ENCOUNTER — Ambulatory Visit (INDEPENDENT_AMBULATORY_CARE_PROVIDER_SITE_OTHER): Payer: Medicare Other | Admitting: Family Medicine

## 2020-12-28 VITALS — HR 78 | Temp 95.0°F

## 2020-12-28 DIAGNOSIS — C189 Malignant neoplasm of colon, unspecified: Secondary | ICD-10-CM | POA: Diagnosis not present

## 2020-12-28 DIAGNOSIS — C787 Secondary malignant neoplasm of liver and intrahepatic bile duct: Secondary | ICD-10-CM | POA: Diagnosis not present

## 2020-12-28 MED ORDER — DIPHENHYDRAMINE HCL 25 MG PO CAPS: 25.0000 mg | ORAL_CAPSULE | Freq: Four times a day (QID) | ORAL | 1 refills | Status: AC | PRN

## 2020-12-28 NOTE — Progress Notes (Signed)
   Subjective:    Patient ID: Joel Dudley, male    DOB: 09-10-1970, 50 y.o.   MRN: [id number]  HPI He is here with his parents.  He has chosen to not have any chemotherapy and his parents especially his mother has been caring for him at home.  They are not interested in having hospice care.  He has not been eating or drinking much lately and has had some difficulty with coughing and the mother feels that he might have fluid in his lungs.   Review of Systems     Objective:   Physical Exam Alert and very cachectic looking and quite jaundiced.  Cardiac exam shows regular rhythm without murmurs or gallops.  Lungs show decreased breath sounds in the lower fields.       Assessment & Plan:  Metastatic colon cancer to liver ([place]) - Plan: diphenhydrAMINE (BENADRYL ALLERGY) 25 mg capsule Explained that if he has fluid in his lungs it could easily be the kind that could not be removed with the diuretic as she was hoping.  Explained that it is probably related to metastatic disease to his lungs.  Discussed the use of other medications and I will give Benadryl to help with the itching.  Encouraged him to keep him as hydrated as possible.  Explained that at this point blood work and intervention could potentially be counterproductive.  They seem to understand this.  I again discussed hospice care and they are definitely not interested.

## 2020-12-28 NOTE — Telephone Encounter (Signed)
Error

## 2021-01-13 DIAGNOSIS — G9340 Encephalopathy, unspecified: Secondary | ICD-10-CM | POA: Diagnosis not present

## 2021-01-13 DIAGNOSIS — R791 Abnormal coagulation profile: Secondary | ICD-10-CM | POA: Diagnosis present

## 2021-01-13 DIAGNOSIS — Z515 Encounter for palliative care: Secondary | ICD-10-CM | POA: Diagnosis not present

## 2021-01-13 DIAGNOSIS — Z85038 Personal history of other malignant neoplasm of large intestine: Secondary | ICD-10-CM | POA: Diagnosis not present

## 2021-01-13 DIAGNOSIS — R17 Unspecified jaundice: Secondary | ICD-10-CM | POA: Diagnosis not present

## 2021-01-13 DIAGNOSIS — A419 Sepsis, unspecified organism: Secondary | ICD-10-CM | POA: Diagnosis not present

## 2021-01-13 DIAGNOSIS — E872 Acidosis: Secondary | ICD-10-CM | POA: Diagnosis present

## 2021-01-13 DIAGNOSIS — J9811 Atelectasis: Secondary | ICD-10-CM | POA: Diagnosis not present

## 2021-01-13 DIAGNOSIS — K5909 Other constipation: Secondary | ICD-10-CM | POA: Diagnosis present

## 2021-01-13 DIAGNOSIS — E876 Hypokalemia: Secondary | ICD-10-CM | POA: Diagnosis not present

## 2021-01-13 DIAGNOSIS — C189 Malignant neoplasm of colon, unspecified: Secondary | ICD-10-CM | POA: Diagnosis not present

## 2021-01-13 DIAGNOSIS — Z7401 Bed confinement status: Secondary | ICD-10-CM | POA: Diagnosis not present

## 2021-01-13 DIAGNOSIS — Z5329 Procedure and treatment not carried out because of patient's decision for other reasons: Secondary | ICD-10-CM | POA: Diagnosis present

## 2021-01-13 DIAGNOSIS — R0902 Hypoxemia: Secondary | ICD-10-CM | POA: Diagnosis present

## 2021-01-13 DIAGNOSIS — C7801 Secondary malignant neoplasm of right lung: Secondary | ICD-10-CM | POA: Diagnosis present

## 2021-01-13 DIAGNOSIS — Z9049 Acquired absence of other specified parts of digestive tract: Secondary | ICD-10-CM | POA: Diagnosis not present

## 2021-01-13 DIAGNOSIS — G809 Cerebral palsy, unspecified: Secondary | ICD-10-CM | POA: Diagnosis present

## 2021-01-13 DIAGNOSIS — N179 Acute kidney failure, unspecified: Secondary | ICD-10-CM | POA: Diagnosis not present

## 2021-01-13 DIAGNOSIS — E43 Unspecified severe protein-calorie malnutrition: Secondary | ICD-10-CM | POA: Diagnosis present

## 2021-01-13 DIAGNOSIS — K219 Gastro-esophageal reflux disease without esophagitis: Secondary | ICD-10-CM | POA: Diagnosis present

## 2021-01-13 DIAGNOSIS — K7689 Other specified diseases of liver: Secondary | ICD-10-CM | POA: Diagnosis not present

## 2021-01-13 DIAGNOSIS — R188 Other ascites: Secondary | ICD-10-CM | POA: Diagnosis not present

## 2021-01-13 DIAGNOSIS — D509 Iron deficiency anemia, unspecified: Secondary | ICD-10-CM | POA: Diagnosis present

## 2021-01-13 DIAGNOSIS — C787 Secondary malignant neoplasm of liver and intrahepatic bile duct: Secondary | ICD-10-CM | POA: Diagnosis present

## 2021-01-13 DIAGNOSIS — K7290 Hepatic failure, unspecified without coma: Secondary | ICD-10-CM | POA: Diagnosis present

## 2021-01-13 DIAGNOSIS — Z6823 Body mass index (BMI) 23.0-23.9, adult: Secondary | ICD-10-CM | POA: Diagnosis not present

## 2021-01-13 DIAGNOSIS — K922 Gastrointestinal hemorrhage, unspecified: Secondary | ICD-10-CM | POA: Diagnosis not present

## 2021-01-13 DIAGNOSIS — D649 Anemia, unspecified: Secondary | ICD-10-CM | POA: Diagnosis present

## 2021-01-13 DIAGNOSIS — Z66 Do not resuscitate: Secondary | ICD-10-CM | POA: Diagnosis present

## 2021-01-19 ENCOUNTER — Telehealth: Payer: Self-pay

## 2021-02-04 NOTE — Telephone Encounter (Signed)
Pt. Mom called just wanting to make you aware that her son had passed away.

## 2021-02-04 DEATH — deceased
